# Patient Record
Sex: Female | Born: 1975
Health system: Southern US, Community
[De-identification: ages and names within clinical notes are randomized; demographics above are authoritative.]

## PROBLEM LIST (undated history)

## (undated) DIAGNOSIS — Z5189 Encounter for other specified aftercare: Secondary | ICD-10-CM

## (undated) DIAGNOSIS — D649 Anemia, unspecified: Secondary | ICD-10-CM

## (undated) DIAGNOSIS — I10 Essential (primary) hypertension: Secondary | ICD-10-CM

## (undated) DIAGNOSIS — M549 Dorsalgia, unspecified: Secondary | ICD-10-CM

## (undated) HISTORY — PX: PARTIAL HYSTERECTOMY: SHX80

## (undated) HISTORY — PX: ABDOMINAL HYSTERECTOMY: SHX81

---

## 2000-01-16 ENCOUNTER — Encounter: Payer: Self-pay | Admitting: Emergency Medicine

## 2000-01-16 ENCOUNTER — Emergency Department (HOSPITAL_COMMUNITY): Admission: EM | Admit: 2000-01-16 | Discharge: 2000-01-16 | Payer: Self-pay | Admitting: Emergency Medicine

## 2000-05-19 ENCOUNTER — Emergency Department (HOSPITAL_COMMUNITY): Admission: EM | Admit: 2000-05-19 | Discharge: 2000-05-19 | Payer: Self-pay | Admitting: Emergency Medicine

## 2000-08-24 ENCOUNTER — Other Ambulatory Visit: Admission: RE | Admit: 2000-08-24 | Discharge: 2000-08-24 | Payer: Self-pay | Admitting: Obstetrics and Gynecology

## 2000-11-19 ENCOUNTER — Emergency Department (HOSPITAL_COMMUNITY): Admission: EM | Admit: 2000-11-19 | Discharge: 2000-11-19 | Payer: Self-pay | Admitting: Emergency Medicine

## 2000-12-05 ENCOUNTER — Ambulatory Visit (HOSPITAL_COMMUNITY): Admission: RE | Admit: 2000-12-05 | Discharge: 2000-12-05 | Payer: Self-pay | Admitting: Obstetrics and Gynecology

## 2000-12-05 ENCOUNTER — Encounter: Payer: Self-pay | Admitting: Obstetrics and Gynecology

## 2001-01-11 ENCOUNTER — Ambulatory Visit (HOSPITAL_COMMUNITY): Admission: RE | Admit: 2001-01-11 | Discharge: 2001-01-11 | Payer: Self-pay | Admitting: Obstetrics and Gynecology

## 2001-01-11 ENCOUNTER — Encounter: Payer: Self-pay | Admitting: Obstetrics and Gynecology

## 2001-04-04 ENCOUNTER — Emergency Department (HOSPITAL_COMMUNITY): Admission: EM | Admit: 2001-04-04 | Discharge: 2001-04-04 | Payer: Self-pay | Admitting: Emergency Medicine

## 2001-05-12 ENCOUNTER — Emergency Department (HOSPITAL_COMMUNITY): Admission: EM | Admit: 2001-05-12 | Discharge: 2001-05-12 | Payer: Self-pay | Admitting: Emergency Medicine

## 2001-08-15 ENCOUNTER — Encounter: Admission: RE | Admit: 2001-08-15 | Discharge: 2001-11-13 | Payer: Self-pay | Admitting: Sports Medicine

## 2002-01-05 ENCOUNTER — Emergency Department (HOSPITAL_COMMUNITY): Admission: EM | Admit: 2002-01-05 | Discharge: 2002-01-05 | Payer: Self-pay | Admitting: *Deleted

## 2002-02-01 ENCOUNTER — Emergency Department (HOSPITAL_COMMUNITY): Admission: EM | Admit: 2002-02-01 | Discharge: 2002-02-01 | Payer: Self-pay | Admitting: Emergency Medicine

## 2002-03-05 ENCOUNTER — Emergency Department (HOSPITAL_COMMUNITY): Admission: EM | Admit: 2002-03-05 | Discharge: 2002-03-05 | Payer: Self-pay | Admitting: Emergency Medicine

## 2002-10-20 ENCOUNTER — Emergency Department (HOSPITAL_COMMUNITY): Admission: EM | Admit: 2002-10-20 | Discharge: 2002-10-20 | Payer: Self-pay | Admitting: Emergency Medicine

## 2002-11-26 ENCOUNTER — Emergency Department (HOSPITAL_COMMUNITY): Admission: EM | Admit: 2002-11-26 | Discharge: 2002-11-26 | Payer: Self-pay | Admitting: Emergency Medicine

## 2002-11-30 ENCOUNTER — Emergency Department (HOSPITAL_COMMUNITY): Admission: EM | Admit: 2002-11-30 | Discharge: 2002-11-30 | Payer: Self-pay | Admitting: Emergency Medicine

## 2002-12-18 ENCOUNTER — Other Ambulatory Visit: Admission: RE | Admit: 2002-12-18 | Discharge: 2002-12-18 | Payer: Self-pay | Admitting: Obstetrics and Gynecology

## 2003-03-20 ENCOUNTER — Emergency Department (HOSPITAL_COMMUNITY): Admission: AD | Admit: 2003-03-20 | Discharge: 2003-03-20 | Payer: Self-pay | Admitting: Family Medicine

## 2003-05-03 ENCOUNTER — Emergency Department (HOSPITAL_COMMUNITY): Admission: EM | Admit: 2003-05-03 | Discharge: 2003-05-03 | Payer: Self-pay | Admitting: Emergency Medicine

## 2003-08-07 ENCOUNTER — Emergency Department (HOSPITAL_COMMUNITY): Admission: EM | Admit: 2003-08-07 | Discharge: 2003-08-07 | Payer: Self-pay | Admitting: Family Medicine

## 2003-11-05 ENCOUNTER — Emergency Department (HOSPITAL_COMMUNITY): Admission: EM | Admit: 2003-11-05 | Discharge: 2003-11-05 | Payer: Self-pay | Admitting: Emergency Medicine

## 2003-12-12 ENCOUNTER — Emergency Department (HOSPITAL_COMMUNITY): Admission: EM | Admit: 2003-12-12 | Discharge: 2003-12-12 | Payer: Self-pay | Admitting: Emergency Medicine

## 2004-04-10 ENCOUNTER — Emergency Department (HOSPITAL_COMMUNITY): Admission: EM | Admit: 2004-04-10 | Discharge: 2004-04-10 | Payer: Self-pay | Admitting: Emergency Medicine

## 2004-12-09 ENCOUNTER — Emergency Department (HOSPITAL_COMMUNITY): Admission: EM | Admit: 2004-12-09 | Discharge: 2004-12-09 | Payer: Self-pay | Admitting: Emergency Medicine

## 2005-01-22 ENCOUNTER — Emergency Department (HOSPITAL_COMMUNITY): Admission: EM | Admit: 2005-01-22 | Discharge: 2005-01-22 | Payer: Self-pay | Admitting: Emergency Medicine

## 2005-02-26 ENCOUNTER — Emergency Department (HOSPITAL_COMMUNITY): Admission: EM | Admit: 2005-02-26 | Discharge: 2005-02-27 | Payer: Self-pay | Admitting: Emergency Medicine

## 2005-02-27 ENCOUNTER — Observation Stay (HOSPITAL_COMMUNITY): Admission: AD | Admit: 2005-02-27 | Discharge: 2005-02-28 | Payer: Self-pay | Admitting: Obstetrics and Gynecology

## 2005-02-27 ENCOUNTER — Emergency Department (HOSPITAL_COMMUNITY): Admission: EM | Admit: 2005-02-27 | Discharge: 2005-02-27 | Payer: Self-pay | Admitting: Family Medicine

## 2005-03-03 ENCOUNTER — Other Ambulatory Visit: Admission: RE | Admit: 2005-03-03 | Discharge: 2005-03-03 | Payer: Self-pay | Admitting: Obstetrics and Gynecology

## 2005-04-06 ENCOUNTER — Encounter (INDEPENDENT_AMBULATORY_CARE_PROVIDER_SITE_OTHER): Payer: Self-pay | Admitting: Specialist

## 2005-04-06 ENCOUNTER — Observation Stay (HOSPITAL_COMMUNITY): Admission: RE | Admit: 2005-04-06 | Discharge: 2005-04-07 | Payer: Self-pay | Admitting: Obstetrics and Gynecology

## 2005-04-23 ENCOUNTER — Inpatient Hospital Stay (HOSPITAL_COMMUNITY): Admission: AD | Admit: 2005-04-23 | Discharge: 2005-04-23 | Payer: Self-pay | Admitting: Obstetrics and Gynecology

## 2005-06-02 ENCOUNTER — Emergency Department (HOSPITAL_COMMUNITY): Admission: EM | Admit: 2005-06-02 | Discharge: 2005-06-02 | Payer: Self-pay | Admitting: Family Medicine

## 2005-07-02 ENCOUNTER — Emergency Department (HOSPITAL_COMMUNITY): Admission: EM | Admit: 2005-07-02 | Discharge: 2005-07-02 | Payer: Self-pay | Admitting: Family Medicine

## 2005-11-01 ENCOUNTER — Inpatient Hospital Stay (HOSPITAL_COMMUNITY): Admission: AD | Admit: 2005-11-01 | Discharge: 2005-11-01 | Payer: Self-pay | Admitting: Obstetrics and Gynecology

## 2005-12-26 ENCOUNTER — Emergency Department (HOSPITAL_COMMUNITY): Admission: EM | Admit: 2005-12-26 | Discharge: 2005-12-27 | Payer: Self-pay | Admitting: Emergency Medicine

## 2005-12-26 ENCOUNTER — Encounter: Payer: Self-pay | Admitting: Obstetrics and Gynecology

## 2006-02-03 ENCOUNTER — Emergency Department (HOSPITAL_COMMUNITY): Admission: EM | Admit: 2006-02-03 | Discharge: 2006-02-03 | Payer: Self-pay | Admitting: Emergency Medicine

## 2006-04-08 ENCOUNTER — Encounter (INDEPENDENT_AMBULATORY_CARE_PROVIDER_SITE_OTHER): Payer: Self-pay | Admitting: Specialist

## 2006-04-08 ENCOUNTER — Inpatient Hospital Stay (HOSPITAL_COMMUNITY): Admission: RE | Admit: 2006-04-08 | Discharge: 2006-04-11 | Payer: Self-pay | Admitting: Obstetrics and Gynecology

## 2006-04-17 ENCOUNTER — Inpatient Hospital Stay (HOSPITAL_COMMUNITY): Admission: AD | Admit: 2006-04-17 | Discharge: 2006-04-17 | Payer: Self-pay | Admitting: Obstetrics and Gynecology

## 2006-11-03 ENCOUNTER — Emergency Department (HOSPITAL_COMMUNITY): Admission: EM | Admit: 2006-11-03 | Discharge: 2006-11-03 | Payer: Self-pay | Admitting: Emergency Medicine

## 2007-05-08 ENCOUNTER — Encounter: Admission: RE | Admit: 2007-05-08 | Discharge: 2007-05-08 | Payer: Self-pay | Admitting: Gastroenterology

## 2007-07-31 ENCOUNTER — Ambulatory Visit (HOSPITAL_COMMUNITY): Admission: RE | Admit: 2007-07-31 | Discharge: 2007-07-31 | Payer: Self-pay | Admitting: Obstetrics and Gynecology

## 2007-07-31 ENCOUNTER — Encounter (INDEPENDENT_AMBULATORY_CARE_PROVIDER_SITE_OTHER): Payer: Self-pay | Admitting: Obstetrics and Gynecology

## 2007-10-03 ENCOUNTER — Emergency Department (HOSPITAL_COMMUNITY): Admission: EM | Admit: 2007-10-03 | Discharge: 2007-10-03 | Payer: Self-pay | Admitting: Emergency Medicine

## 2007-10-08 ENCOUNTER — Emergency Department (HOSPITAL_COMMUNITY): Admission: EM | Admit: 2007-10-08 | Discharge: 2007-10-09 | Payer: Self-pay | Admitting: Emergency Medicine

## 2007-10-30 ENCOUNTER — Emergency Department (HOSPITAL_COMMUNITY): Admission: EM | Admit: 2007-10-30 | Discharge: 2007-10-30 | Payer: Self-pay | Admitting: Emergency Medicine

## 2007-11-12 ENCOUNTER — Emergency Department (HOSPITAL_COMMUNITY): Admission: EM | Admit: 2007-11-12 | Discharge: 2007-11-12 | Payer: Self-pay | Admitting: Family Medicine

## 2007-11-18 ENCOUNTER — Emergency Department (HOSPITAL_COMMUNITY): Admission: EM | Admit: 2007-11-18 | Discharge: 2007-11-18 | Payer: Self-pay | Admitting: Emergency Medicine

## 2008-08-15 IMAGING — CT CT PELVIS W/ CM
3 of 4 series · 12 of 36 positions shown, 18 images · IV contrast (rectal contrast)
Comparison: 12/27/05.

CLINICAL DATA: Sharp right lower abdominal and pelvic pain.  Nausea and vomiting.  Clinical suspicion for appendicitis. 
ABDOMEN CT WITH CONTRAST:
TECHNIQUE: Multidetector CT imaging of the abdomen was performed following the standard protocol during bolus administration of intravenous contrast.
Contrast:  100 cc Omnipaque 300 and oral and rectal contrast.
TECHNIQUE: Multidetector CT imaging of the pelvis was performed following the standard protocol during bolus administration of intravenous contrast.

[Series 2: routine abdomen · axial · 0.77mm/px · z∈[-370,-26]mm · 8 of 89 slices shown, 13 images]
[im 10/89  soft-tissue]
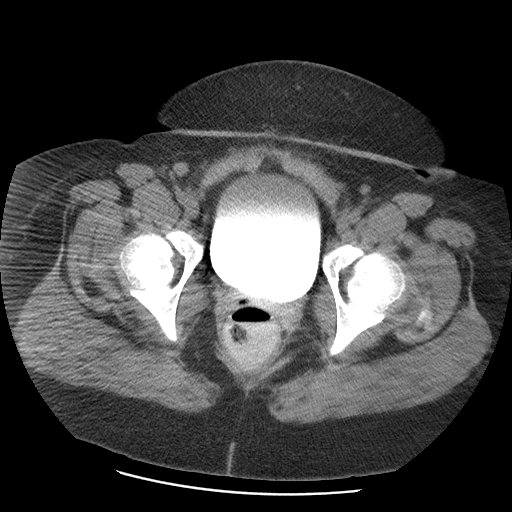
[im 10/89  bone]
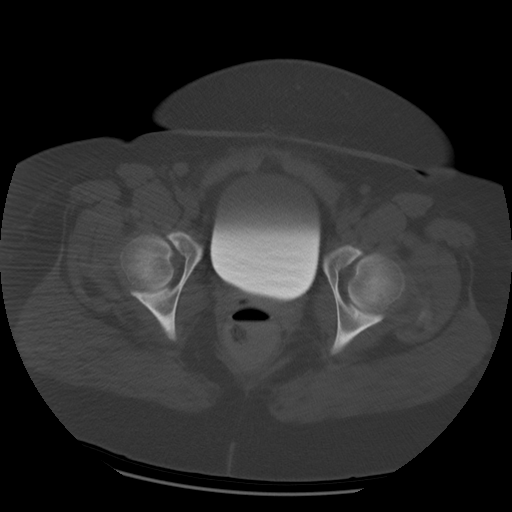
[im 20/89  soft-tissue]
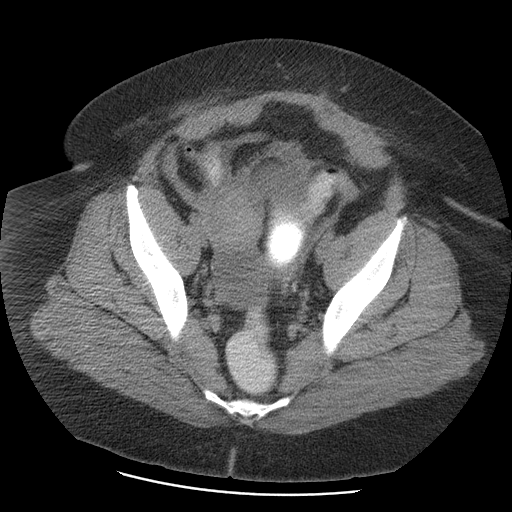
[im 30/89  soft-tissue]
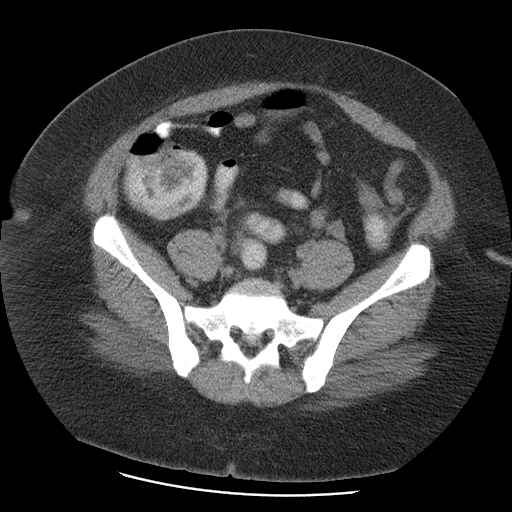
[im 40/89  soft-tissue]
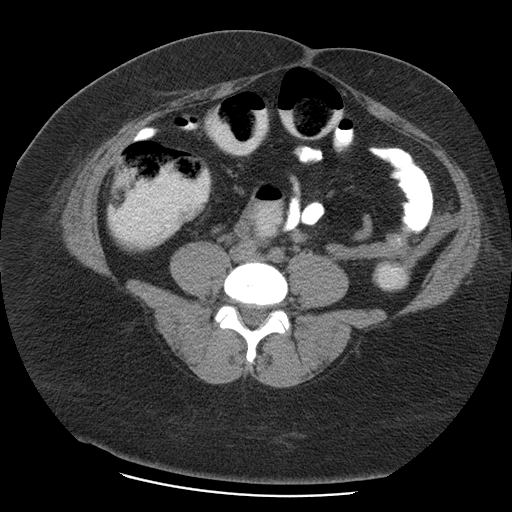
[im 49/89  soft-tissue]
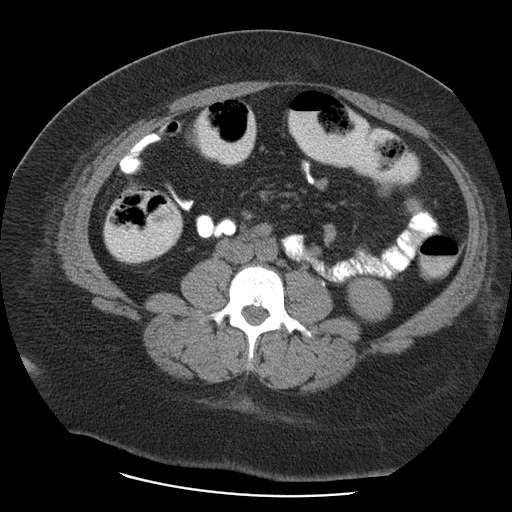
[im 49/89  lung]
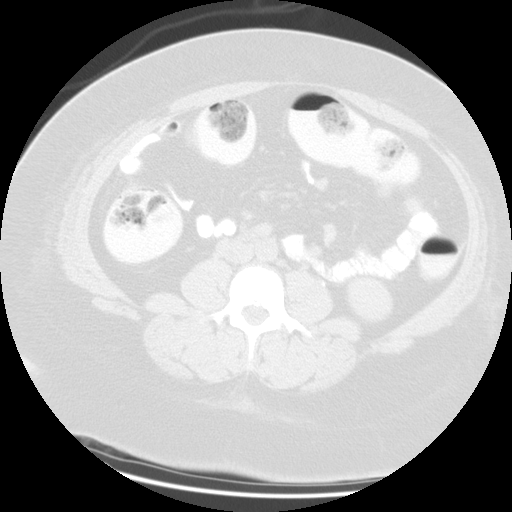
[im 59/89  soft-tissue]
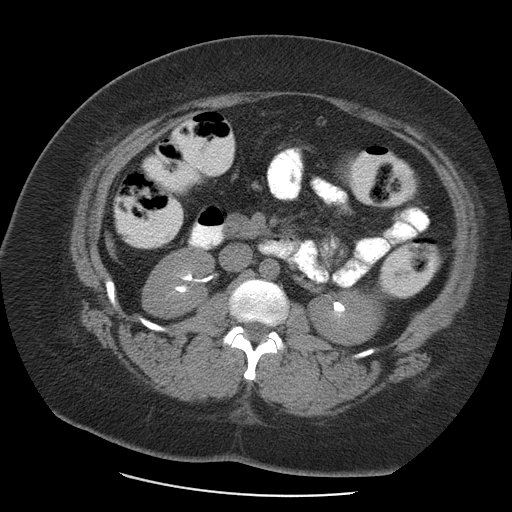
[im 59/89  lung]
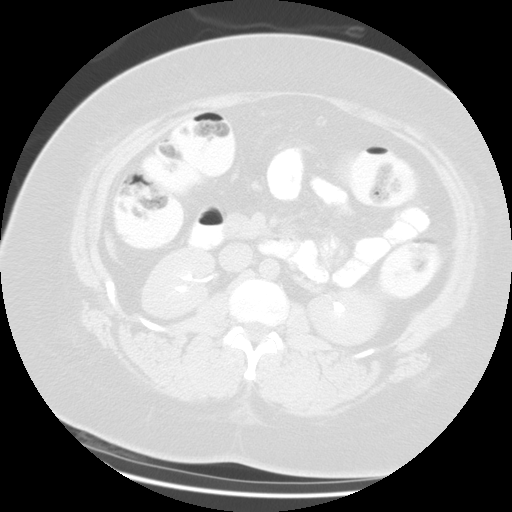
[im 69/89  soft-tissue]
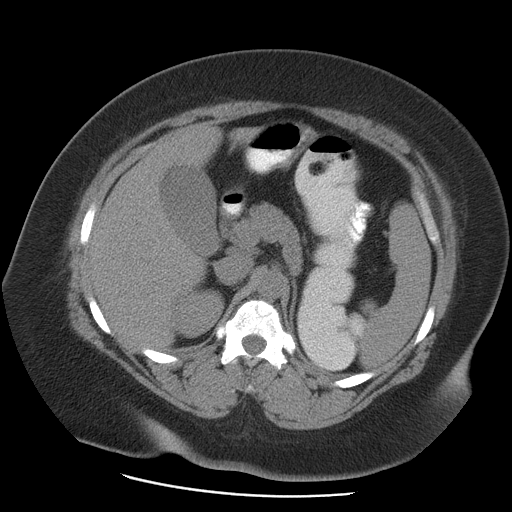
[im 69/89  lung]
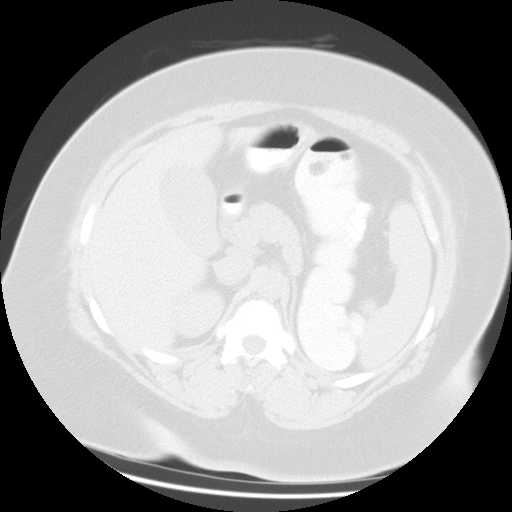
[im 79/89  soft-tissue]
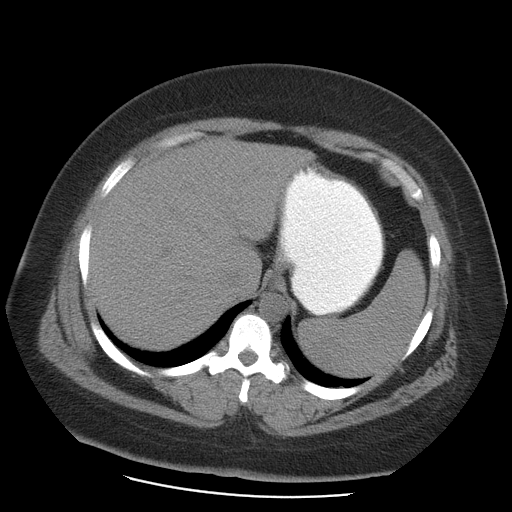
[im 79/89  lung]
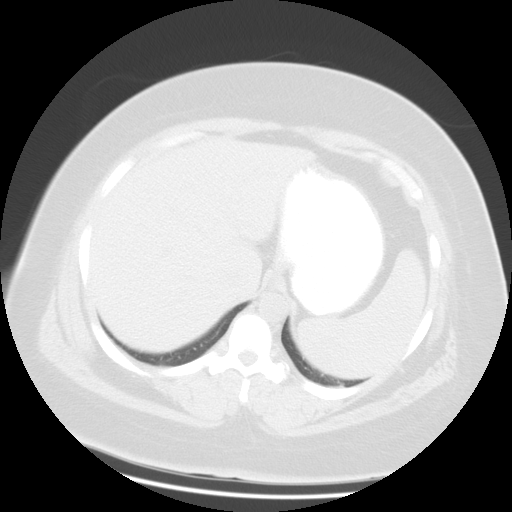

[Series 400: sag · sagittal · 0.95mm/px · 1 of 197 slices shown, 2 images]
[im 66/197  soft-tissue]
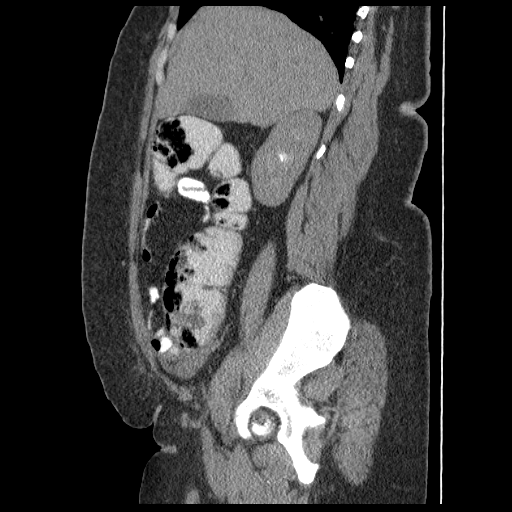
[im 66/197  bone]
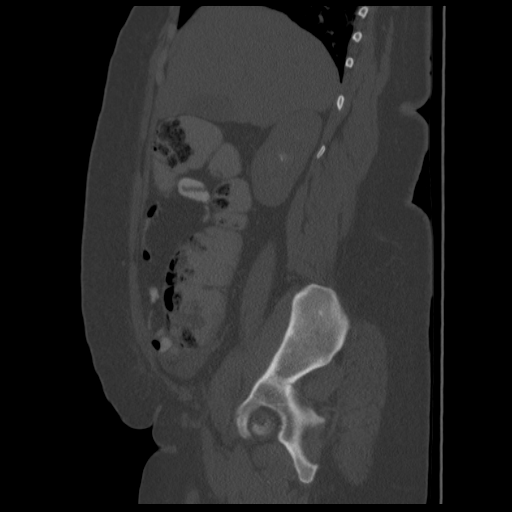

[Series 401: cor · coronal · 0.95mm/px · 3 of 177 slices shown]
[im 20/177  soft-tissue]
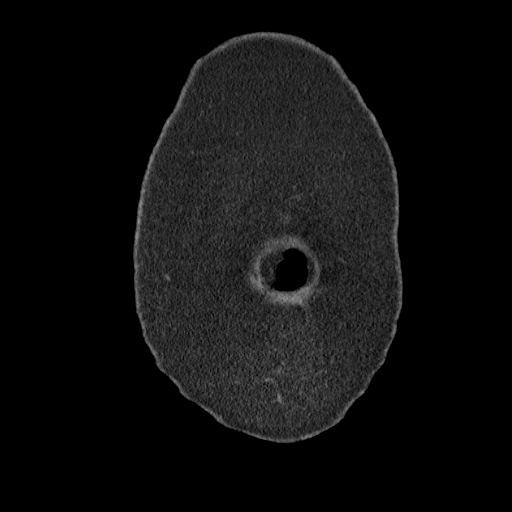
[im 40/177  soft-tissue]
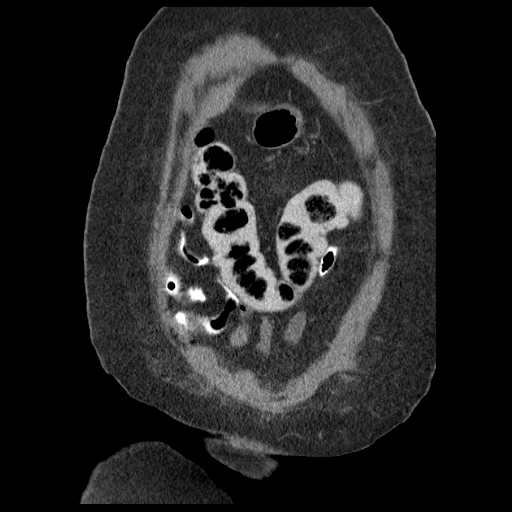
[im 59/177  soft-tissue]
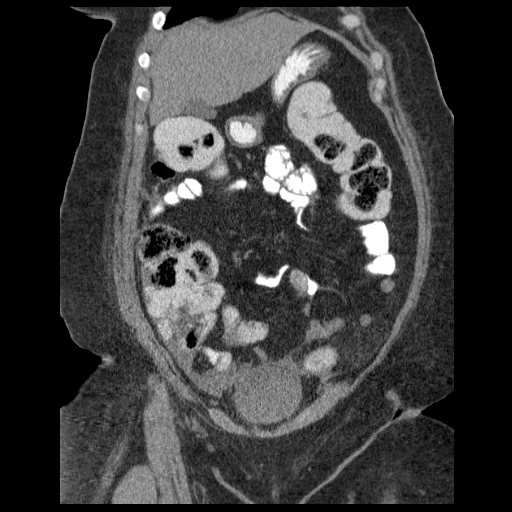

[12 of 36 positions shown; findings below may reference images not displayed]

FINDINGS: The abdominal parenchymal organs are normal in appearance.  There is no evidence of mass or lymphadenopathy within the abdomen.  There is no evidence of inflammatory process or abnormal fluid collections.  There is no evidence of dilated bowel loops.  A small supraumbilical anterior abdominal wall hernia is seen containing only omental fat.
IMPRESSION: 1.  No acute intraabdominal findings. 
2.  Small supraumbilical anterior abdominal wall hernia containing only omental fat.  
PELVIS CT WITH CONTRAST:
FINDINGS: Initial scan was limited due to oral contrast opacification of distal bowel, and the scan was repeated after rectal administration.  The appendix is not well visualized but no definite findings of acute appendicitis are identified.  There is a high attenuation mass in the right adnexa measuring 4.8 X 4.5 cm which appears separate from bowel.  This is new since previous study and is its high attenuation is suspicious for a hemorrhagic ovarian cyst although endometrioma could have a similar appearance.  A small amount of free fluid is seen in the right adnexa and pelvic cul-de-sac.
IMPRESSION: 4.8 cm high attenuation mass in the right adnexa, likely representing hemorrhagic cyst or possibly an endometrioma.  Small amount of free fluid or blood also seen in the right adnexa and pelvic cul-de-sac.  Follow-up with pelvic ultrasound is recommended in six weeks.

## 2008-12-25 ENCOUNTER — Encounter: Admission: RE | Admit: 2008-12-25 | Discharge: 2008-12-25 | Payer: Self-pay | Admitting: Family Medicine

## 2009-06-20 ENCOUNTER — Inpatient Hospital Stay (HOSPITAL_COMMUNITY): Admission: AD | Admit: 2009-06-20 | Discharge: 2009-06-20 | Payer: Self-pay | Admitting: *Deleted

## 2010-01-30 ENCOUNTER — Emergency Department (HOSPITAL_COMMUNITY)
Admission: EM | Admit: 2010-01-30 | Discharge: 2010-01-31 | Payer: Self-pay | Source: Home / Self Care | Admitting: Emergency Medicine

## 2010-05-12 LAB — GC/CHLAMYDIA PROBE AMP, URINE: GC Probe Amp, Urine: NEGATIVE

## 2010-05-12 LAB — URINE MICROSCOPIC-ADD ON

## 2010-05-12 LAB — URINALYSIS, ROUTINE W REFLEX MICROSCOPIC
Nitrite: NEGATIVE
Protein, ur: NEGATIVE mg/dL
Urobilinogen, UA: 0.2 mg/dL (ref 0.0–1.0)
pH: 6 (ref 5.0–8.0)

## 2010-05-12 LAB — WET PREP, GENITAL

## 2010-07-07 NOTE — Op Note (Signed)
[name], [name]              ACCOUNT NO.:  [id number]   MEDICAL RECORD NO.:  [id number]          PATIENT TYPE:  AMB   LOCATION:  SDC                           FACILITY:  [facility]   PHYSICIAN:  Valerie A. Dillard, M.D. DATE OF BIRTH:  1975/05/03   DATE OF PROCEDURE:  DATE OF DISCHARGE:                               OPERATIVE REPORT   PREOPERATIVE DIAGNOSES:  Chronic pelvic pain, endometriosis, and right  ovarian cyst.   POSTOPERATIVE DIAGNOSES:  Chronic pelvic pain, endometriosis, and right  ovarian cyst with abdominal adhesions.   PROCEDURES:  Open laparoscopy, right salpingo-oophorectomy.   SURGEON:  Valerie A. Dillard, MD   ASSISTANT:  Osborn Coho, MD.   FINDINGS:  Omental abdominal wall adhesions, calcifications, healing  along area of the cuff.  There was 1 purple to bluish lesion right along  the bladder near the cuff.  There was normal appearing appendix and then  the right ovary when it was being removed inside the EndoCatch bag, it  did appear that there was chocolate cyst fluid within the ovarian wall  consistent with endometrioma.  Right ovary and tube were sent to  pathology.   ESTIMATED BLOOD LOSS:  Minimal.   URINE OUTPUT:  500 mL of clear urine.   COMPLICATIONS:  None.   DISPOSITION:  The patient to PACU in stable condition.   PROCEDURE IN DETAIL:  The patient was taken to the operating room where  she was given general anesthesia, placed in dorsal lithotomy position  and prepped and draped in a normal sterile fashion.  A Foley catheter  was placed.  A Sponge and a stick was placed in the vagina.  Attention  was turned to the umbilicus where 5 mL 0.25% Marcaine with epi was  placed.  A transverse incision was made along her previous incision and  carried down to fascia.  The fascia was incised in the midline and  extended bilaterally and were in the abdominal cavity.  The fascia was  then circumscribed with 0 Vicryl and a Hasson was placed and anchored.  Upon looking into the abdominal cavity, the findings noted above were  seen.  Two 5-mm trocars were placed on the right and left lower  quadrants avoiding the epigastric arteries.  Marcaine was used and those  incisions off the patient's right ovary .  The patient's right ureter  was seen and noted to be peristaltic.  Two EndoCatch loops were placed  on the right infundibulopelvic ligament.  The infundibulopelvic ligament  was then cut and cauterized with Harmonic scalpel.  A 5-mm scope was  then placed into the lower left lower quadrant port and 10-mm bag was  placed into the pelvis.  The ovary was placed into the EndoCatch bag.  The EndoCatch bag did not fit out of the entire incisions so placing a  Kocher in the bag, we removed the ovary without difficulty.  The  endometrial fluid was seen to come out of the ovary as i had to put the  Porter on the ovary itself to remove it out of the incision.  The Hasson  was  removed and it was replaced and we insufflated with a 5-mm scope.  The abdominal wall adhesions did not involve any bowel and they were  hemostatic at the end of the case.  Her liver was noted to be normal.  Appendix was noted to be normal.  All instruments were then removed from  the abdomen.  After irrigation was done under direct visualization of  the laparoscope.  The fascial incision at the umbilicus was closed by  tying the pursestring suture.  The skin at the umbilicus was closed  using 3-0 Monocryl.  The 2 other small incisions were closed using  Dermabond.  Foley catheter was removed. The sponge and the stick was  removed from the vagina.  Sponge, lap, and needle counts were correct.  The patient went to recovery room in stable condition.  Before the  surgery, as the patient wanted to retain her ovaries, as it looked  normal, she says, she wanted it out secondary to pain and this her third  surgery in 3 years.  The patient understands that she may need hormone   replacement as a result of menopausal symptoms from having her ovary  removed.      [name], M.D.  Electronically Signed     NAD/MEDQ  D:  07/31/2007  T:  08/01/2007  Job:  [id number]

## 2010-07-07 NOTE — H&P (Signed)
[name], [name]              ACCOUNT NO.:  [id number]   MEDICAL RECORD NO.:  [id number]          PATIENT TYPE:  AMB   LOCATION:                                [facility]ACILITY:  [facility]   PHYSICIAN:  Naima A. Dillard, M.D. DATE OF BIRTH:  [birth year]   DATE OF ADMISSION:  DATE OF DISCHARGE:                              HISTORY & PHYSICAL   CHIEF COMPLAINT:  Chronic pelvic pain with ovarian cyst.   The patient is a 35 year old gravida 3, para 3 who has been followed for  endometriosis and chronic pelvic pain over the past several years by  myself.  In 2007 the patient had an LAVH secondary to dysfunctional  uterine bleeding and menorrhagia with cystectomy and was found to have  endometriosis.  She then had a left salpingo-oophorectomy in 2008 for  pain and endometriosis and wanted to keep her right ovary.  The patient  then came in complaining of constipation and pain in the last couple of  months and was seen by the GI doctor who felt like this was just her  endometriosis.  On ultrasound the patient had an enlarged right ovary  measuring 6 x 6 x 6 and chose to do observation.  After so many months  the patient had so much pain that she has decided to remove the ovary.  She was given the options of suppression with Lupron or birth control  pills but declined.  She was also optioned to have surgery with a gyn  oncologist but declined.   PAST MEDICAL HISTORY:  Significant for anemia.   PAST OBSTETRICAL HISTORY:  Significant for vaginal delivery times one  and cesarean section times two.   ALLERGIES:  No known drug allergies.   SOCIAL HISTORY:  The patient is married and husband is supportive.  She  denies any alcohol, tobacco or drug use.   FAMILY HISTORY:  Is significant for high blood pressure and diabetes.   REVIEW OF SYSTEMS:  CARDIOVASCULAR:  No heart palpitations.  LUNGS:  No  asthma.  GENITOURINARY:  Significant for endometriosis.  MUSCULOSKELETAL:  No weakness.  ENDOCRINE:  No  thyroid disease.   PHYSICAL EXAM:  VITAL SIGNS:  The patient weighs 298 pounds.  Blood  pressure is 120/88, temperature is 98.4.  HEENT:  Pupils equal, round and reactive to light.  Throat is clear.  Thyroid is not enlarged.  HEART:  Has regular rate and rhythm.  LUNGS:  Clear to auscultation bilaterally.  BREASTS:  No masses, skin changes or nipple retraction.  BACK:  No CVA tenderness bilaterally.  ABDOMEN:  Abdomen is nontender without any masses or organomegaly.  EXTREMITIES:  No cyanosis, clubbing or edema.  NEURO:  Exam within normal limits.  VAGINAL:  Exam within normal limits.  Cervix and uterus and left adnexa  are all surgically absent.  Right adnexa with some mild pain.   ASSESSMENT:  Endometriosis and persistent ovarian cyst consistent with  endometrioma and hydrosalpinx.  The patient has decided to have her  right ovary removed.  She understands that she may need hormone  replacement.  She is scheduled  to have a bowel prep.  She understands  the risks of the surgery are not limited to bleeding, infection, damage  to internal organs such as bowel, bladder, major blood vessels.  The  patient also understands she may have chronic pelvic pain for years and  may need to be referred to a pain clinic.  She still wants this ovary  removed.      Naima A. Normand Sloop, M.D.  Electronically Signed     NAD/MEDQ  D:  07/28/2007  T:  07/28/2007  Job:  [id number]

## 2010-07-10 NOTE — Op Note (Signed)
[name], [name]              ACCOUNT NO.:  [id number]   MEDICAL RECORD NO.:  [id number]          PATIENT TYPE:  AMB   LOCATION:  SDC                           [facility]ACILITY:  [facility]   PHYSICIAN:  Naima A. Dillard, M.D. DATE OF BIRTH:  01-Apr-1975   DATE OF PROCEDURE:  04/08/2006  DATE OF DISCHARGE:                               OPERATIVE REPORT   PREOPERATIVE DIAGNOSES:  1. Pelvic pain.  2. Persistent left ovarian cyst.   POSTOPERATIVE DIAGNOSES:  1. Pelvic pain.  2. Persistent left ovarian cyst.  3. Left endometrioma.  4. Dense pelvic adhesions.   PROCEDURE:  1. Left salpingo-oophorectomy with lysis of adhesions.  2. Cystoscopy.   SURGEON:  Dr. Normand Sloop.   ASSISTANTS:  1. Dr. Su Hilt.  2. Henreitta Leber.   ANESTHESIA:  General.   SPECIMENS:  Left tubo-ovarian complex, sent to Pathology.   ESTIMATED BLOOD LOSS:  200 mL.   IV FLUIDS:  2500 mL.   URINE OUTPUT:  700 mL.   COMPLICATIONS:  None.   The patient went to PACU in stable condition.   PROCEDURE:  The patient was taken to the operating room where she was  given a general anesthesia, placed in the dorsal lithotomy position,  prepped and draped in a normal sterile fashion.  Sponge-on-a-stick was  placed into the vagina just so we would know where we are.  Attention  was then turned to the umbilicus where 5 mL 0.25% Marcaine was placed in  the incision.  The incision was then made on a prior incision with a  knife and carried down to the fascia.  Using the knife, fascia was  incised in the midline, peritoneum identified, tented up and entered  sharply.  The fascia was then circumscribed with #0 Vicryl.  The Hasson  was placed into the abdominal cavity and anchored with the suture placed  in the fascia.  The abdomen was then insufflated with CO2 gas.  Intraabdominal placement was confirmed with laparoscope.  The findings  were as noted.  Liver appeared normal.  Right ovary was normal.  There  was a large left  ovarian cyst with dense pelvis and colonic adhesions.  Two 5 mm trocars were placed on the right and left lower quadrants and  one 10 mm trocar was placed in the midline 2 cm above the symphysis  pubis.  Irrigation was done and then began dissecting both sharply with  Metzenbaum scissors, then bluntly in the colonic and dense pelvic  adhesions.  However, I was able to dissect the left ovary from the  pelvis but there were some colonic adhesions that I just could not  dissect towards the left side of the ovary.  Initially had trouble  maintaining the patient in Trendelenburg and retracting the bowel  because of her size, so decision was made to open the patient.  A  Pfannenstiel skin incision was made along the laparoscopic port and  along a previous incision with the knife.  This was carried down to the  fascia using Bovie cautery.  The fascia was incised and the midline  extended bilaterally using  Mayo scissors and pickups with teeth.  Kochers x2 were placed on the inferior aspect of the fascia.  I was able  to dissect out the rectal mucosa both sharply and bluntly.  The superior  aspect of the fascia was dissected in a similar fashion.  The muscles  were separated in the midline.  The peritoneum was identified, entered  sharply and dissected from superiorly down.  The irrigation that was  previously in the pelvis was irrigated out.  The bowel was packed away  with moist laparotomy sponges.  The O'Connor-O'Sullivan was placed.  There were some still dense adhesions along the infundibulopelvic  ligament and left ovary and tube, they were dissected both sharply and  bluntly.  First the tube was removed to get better visualization, then  part of the ovary, then finally I could see the IP but I just could not  see the ureter because of the dense adhesions.  The infundibulopelvic  ligament was then clamped as high as possible, it was tied with a free  tie and then suture ligated with #0  Vicryl.  Hemostasis was assured.  There was some oozing just in the pelvis after the dissection that was  made hemostatic with Gelfoam.  Interceed was placed around the right  normal ovary.  Irrigation was done, hemostasis was assured.  I then did  a cystoscopy.  Both ureters had good efflux with indigo carmine from  both ureters, ensuring that the left ureter was normal.  Then I  proceeded to close the peritoneum with #0 chromic.  The rectal mucosa  was irrigated and made hemostatic with Bovie cautery any bleeding areas.  The fascia was closed with #0 Vicryl in a running fashion.  The  subcutaneous tissue was irrigated.  A JP drain was placed and the  subcutaneous tissue was reapproximated using #2-0 plain.  The skin  incision was closed using #3-0 Monocryl the laparoscopic port at the  umbilicus.  The fascia was closed with #0 Vicryl, the skin was closed  with #4-0 Monocryl.  The JP drain was sutured in with silk.  Sponge, lap  and needle counts were correct.  The patient went to the recovery room  in stable condition.  The sponge stick was removed from the vagina  before the patient was awakened.      Naima A. Normand Sloop, M.D.  Electronically Signed     NAD/MEDQ  D:  04/08/2006  T:  04/08/2006  Job:  [id number]

## 2010-07-10 NOTE — H&P (Signed)
NAMENELSON, JULSON              ACCOUNT NO.:  [id number]   MEDICAL RECORD NO.:  [id number]          PATIENT TYPE:  AMB   LOCATION:  [facility]C                           [facility]ACILITY:  [facility]   PHYSICIAN:  Naima A. Dillard, M.D. DATE OF BIRTH:  01/28/1976   DATE OF ADMISSION:  DATE OF DISCHARGE:                              HISTORY & PHYSICAL   ADDENDUM TO PREVIOUS H&P NUMBER [id number]   ADDENDUM:  Her height is 5 feet 7 and one-half inches tall.      Elmira J. Adline Peals.      Naima A. Normand Sloop, M.D.  Electronically Signed    EJP/MEDQ  D:  04/04/2006  T:  04/04/2006  Job:  [id number]

## 2010-07-10 NOTE — Discharge Summary (Signed)
NAMEMORRISON, [name]              ACCOUNT NO.:  [id number]   MEDICAL RECORD NO.:  [id number]          PATIENT TYPE:  OBV   LOCATION:  9309                          [facility]ACILITY:  [facility]   PHYSICIAN:  Naima A. Dillard, M.D. DATE OF BIRTH:  [birth year]   DATE OF ADMISSION:  04/06/2005  DATE OF DISCHARGE:  04/07/2005                                 DISCHARGE SUMMARY   DISCHARGE DIAGNOSES:  1.  Dysfunctional uterine bleeding.  2.  Adenomyosis.  3.  Menorrhagia.  4.  Left endometrioma.  5.  Pelvic adhesions.   OPERATION:  On the date of admission the patient underwent a  laparoscopically-assisted vaginal hysterectomy with lysis of adhesions and a  left ovarian cystectomy, tolerating all procedures well.   HISTORY OF PRESENT ILLNESS:  Ms. [name] is a 35 year old female para 3-0-0-3  who presented to the hospital with severe anemia (hemoglobin 6.4) believed  to be secondary to menorrhagia. A pelvic ultrasound done at that time  revealed that the patient had a 7 cm adnexal mass for which the patient  desired surgical management along with definitive treatment of her  menorrhagia. Please see the patient's dictated history and physical  examination for details.   PREOPERATIVE PHYSICAL EXAMINATION:  VITAL SIGNS:  Blood pressure 138/100.  Repeat blood pressure 110/80, weight 300 pounds.  GENERAL EXAM:  Within normal limits.  PELVIC EXAM:  Complete vaginal exam within normal limits. Cervix is  nontender without lesions. Uterus is normal size, shape and consistency and  nontender. Adnexa had no palpable masses and are nontender.   HOSPITAL COURSE:  On the date of admission the patient underwent  aforementioned procedures, tolerating them all well. Postoperative course  was unremarkable with the patient tolerating a postoperative hemoglobin of  8.9 (preoperative hemoglobin 10.9). By postoperative day #1 the patient had  resumed bowel and bladder function and was therefore deemed ready for  discharge home.   DISCHARGE MEDICATIONS:  1.  Iron tablets twice daily for 6 weeks.  2.  Colace 100 mg twice daily until bowel movements are regular.  3.  Tylox one to two tablets every 4 hours as needed for pain.  4.  Ibuprofen 600 mg with food every 6 hours for 3 days, then as needed for      pain.  5.  Phenergan 25 mg every 6 hours as needed for nausea.   FOLLOWUP:  The patient is scheduled for 6 weeks postoperative visit with Dr.  Normand Sloop on May 18, 2005, at 1:45 p.m.   DISCHARGE INSTRUCTIONS:  The patient was given a copy of [facility] postoperative instruction sheet. She was further advised to avoid  driving for 2 weeks, heavy lifting for 4 weeks, intercourse for 6 weeks,  that she may shower, walk up steps and is to increase her activities slowly.  The patient's diet was without restriction. Final pathology is not legible.      [name].      Naima A. Normand Sloop, M.D.  Electronically Signed    EJP/MEDQ  D:  05/13/2005  T:  05/14/2005  Job:  [id number]

## 2010-07-10 NOTE — Discharge Summary (Signed)
[name], CHARITY              ACCOUNT NO.:  [id number]   MEDICAL RECORD NO.:  [id number]          PATIENT TYPE:  INP   LOCATION:  9317                          FACILITY:  [facility]   PHYSICIAN:  Naima A. Dillard, M.D. DATE OF BIRTH:  [birth year]   DATE OF ADMISSION:  04/08/2006  DATE OF DISCHARGE:  04/11/2006                               DISCHARGE SUMMARY   DISCHARGE DIAGNOSIS:  1. Pelvic pain.  2. Left endometrioma.  3. Pelvic adhesions.   OPERATION:  On the date of admission, the patient underwent a diagnostic  laparoscopy, followed by an exploratory laparotomy with lysis of  adhesions, and a left salpingo-oophorectomy, tolerating all procedures  well.  The patient also underwent a cystoscopy which revealed patent  ureters bilaterally.   HISTORY OF PRESENT ILLNESS:  [name] is a 35 year old, married,  African American female para 3-0-0-3, who is status post hysterectomy  who presents for laparoscopic left salpingo-oophorectomy because of a  persistent complex left ovarian cyst and pelvic pain.  Please see the  patient's dictated history and physical examination for details.   PHYSICAL EXAMINATION:  VITAL SIGNS:  Blood pressure 120/90, weight is  200 pounds.  GENERAL:  within normal limits.  PELVIC:  EG/BUS is within normal limits.  Vagina is rugose.  Cervix and  uterus are surgically absent.  Adnexa with left adnexal tenderness,  however, there are no palpable masses.  Right adnexa without tenderness  or masses.   HOSPITAL COURSE:  On the date of admission, the patient underwent  aforementioned procedures tolerating them all well.  The patient's  postoperative course was marked by a slow return of bowel function and  what was believed to be bleeding per rectum.  After further evaluation,  it was believed that the patient's rectal bleeding was a result of red  Jello that she had consumed during her hospital stay.  By post-op day  #3, the patient had resumed bowel and  bladder function and was therefore  deemed ready for discharge home.   DISCHARGE LABORATORY:  The white blood count 6.6, hemoglobin 9.3,  hematocrit 27.8, platelets 189.  The patient's basic metabolic panel was  within normal limits.  The patient's preoperative hemoglobin and  hematocrit were 11 and 33.5, respectively.   DISCHARGE MEDICATIONS:  1. Percocet 5/325, one to two tablets every 4 hours as needed for      pain.  2. Ibuprofen 800 mg with food every 8 hours for 3 days, then as needed      for pain.  3. Colace 100 mg twice daily until bowel movements are regular.   FOLLOWUP:  1. The patient was advised to call [facility] at [phone], to schedule an incision check in 2 weeks and then a      postoperative visit with Dr. Normand Powell in 6 weeks.  2. The patient was also advised to call and schedule an appointment      with Dr. Laurann Powell for further evaluation of rectal bleeding      within 2 weeks following her discharge.   DISCHARGE  INSTRUCTIONS:  The patient was given a copy of [facility] postoperative instruction sheet.  She was further  advised to avoid driving for 2 weeks, heavy lifting for 4 weeks,  intercourse for 6 weeks, that she may walk up steps, that she may  shower, should increase her activities slowly, and her diet was without  restriction.   FINAL PATHOLOGY:  Left ovary and fallopian tube - tubal ovarian complex  with acute salpingo-oophoritis and hemorrhage.      Elmira J. Adline Peals.      Naima A. [name], M.D.  Electronically Signed    EJP/MEDQ  D:  04/30/2006  T:  04/30/2006  Job:  [id number]

## 2010-07-10 NOTE — Op Note (Signed)
[name], [name]              ACCOUNT NO.:  [id number]   MEDICAL RECORD NO.:  [id number]          PATIENT TYPE:  OBV   LOCATION:  9309                          FACILITY:  [facility]   PHYSICIAN:  Naima A. Dillard, M.D. DATE OF BIRTH:  [birth year]   DATE OF PROCEDURE:  04/06/2005  DATE OF DISCHARGE:                                 OPERATIVE REPORT   PREOPERATIVE DIAGNOSES:  1.  Dysfunctional uterine bleeding.  2.  Menorrhagia.  3.  Adnexal mass.   POSTOPERATIVE DIAGNOSES:  1.  Dysfunctional uterine bleeding.  2.  Menorrhagia.  3.  Adnexal mass.  4.  Left endometrioma.  5.  Pelvic adhesions.  6.  Right ovarian simple cyst.   OPERATION/PROCEDURE:  1.  Laparoscopic-assisted vaginal hysterectomy.  2.  Lysis of adhesions.  3.  Left ovarian cystectomy.   SURGEON:  Naima A. Normand Sloop, M.D.   ASSISTANTMarquis Lunch. Powell, P.A.-C.   ANESTHESIA:  General.   SPECIMENS:  Uterus, cervix, and left ovarian cyst wall.   ESTIMATED BLOOD LOSS:  400 mL.   URINARY OUTPUT:  150 mL clear urine.   IV FLUIDS:  4050 mL crystalloid.   COMPLICATIONS:  None.   CONDITION:  The patient went to the PACU in stable condition.   DESCRIPTION OF PROCEDURE:  The patient was taken to the operating room where  she was given general anesthesia and placed in the dorsal lithotomy  position, prepped and draped in the normal sterile fashion.  A bivalve  speculum was placed in the vagina.  The anterior lip of the cervix grasped  with a single-tooth tenaculum.  Hulka tenaculum was placed into the uterus.  The tenaculum and speculum were removed and Hulka was used to manipulate the  uterus.  Attention was then turned to the abdomen where a 10 mm  infraumbilical incision was made with the scalpel and carried down to the  fascia. The fascia was incised in the midline and extended bilaterally.  The  peritoneum was identified and entered sharply.  A pursestring suture of 0  Vicryl was placed around the fascia.   Hasson was placed into the intra-  abdominal and the abdomen was inflated with CO2 gas.  Intra-abdominal  placement was confirmed with the laparoscope.  Two 5 mm incisions were made.  Marcaine 0.25%, 3 mL, were placed in each trocar site.  The 5 mm incisions  were made and each trocar placed under direct visualization of the  laparoscope.  The patient had a large, about 7 cm, ovarian cyst on the  right. This was a small simple cyst.  Appendix appeared normal.  Abdominal  anatomy appeared normal.  She had large omental abdominal wall adhesions and  she had some left ovarian pelvic sidewall and tubal adhesions.  The very  first thing that was done was the omental adhesions.  There was no bowel in  the omental adhesions and the adhesion was taken down with sharp scissors on  cautery.  Hemostasis was assured.  The left ovarian tubal and sigmoid  adhesions were taken down with caution towards the sigmoid with both sharply  and bluntly without cautery.  The cul-de-sac in the anterior and the bladder  was found to be free.  The cyst was interrupted and noted to be  endometrioma.  Chocolate fluid was seen to exudate out of the cyst and this  was irrigated copiously with the suction irrigator.  The cyst wall was  removed without difficulty. Any bleeding areas were made hemostatic with the  Gyrus.  Upon this, both round ligament were grasped with a Gyrus and cut.  The bladder flap was then made with hydrodissection and vesicouterine  peritoneum was cut with the Metzenbaum scissors.  The bladder was dissected  away from the uterus and cervix.  The utero-ovarian ligaments were  cauterized and cut using the gyrus.  Before doing this, we tried to  visualize the ureters on both sides because the patient has had  endometriosis and there were endometrial implants all along the anterior  abdominal wall and in the posterior cul-de-sac.  As much as we could remove,  was removed.   Attention was then turned  to the vagina where a weighted speculum was placed  into the vagina and the Hulka was removed.  Two single-tooth tenacula were  placed on the cervix and the cervix was circumscribed with 20 mL of  Pitressin, 20 units in 100 mL saline.  A circumscribed incision was made  with the scalpel.  The bladder and rectal mucosa was sharply and bluntly  dissected away from the cervix.  The posterior cul-de-sac was entered  sharply with Mayo scissors and both uterosacral ligaments were clamped with  Heaney clamps, cut and ligated.  The anterior cul-de-sac was then also  opened and the bladder was seen to be fine.  Both cardinal ligaments were  clamped with Heaney clamps, cut and then suture ligated with 0 Vicryl.  The  uterine arteries were clamped and ligated.  Hemostasis was assured.  The  uterus was removed without difficulty and handed off to be sent to  pathology. The cuff was then irrigated. Any bleeding areas were hemostatic  with suture.  The McCall suture was placed using 0 Vicryl.  The cuff was  closed using 0 chromic in running locked fashion.  The McCall suture was  then tied both uterosacral ligaments were tied together and anchor stitched.  Hemostasis was assured.   Attention was then turned to the abdomen. Again the abdomen was insufflated  with CO2 gas. All areas were noted to be hemostatic.  Earlier in the case,  before taking down the utero-ovarian pedicles, we also needed more traction  on the uterus.  A 5 mm trocar was placed in the suprapubic region under  direct visualization of the laparoscope.  Copiously irrigated was done.  There were some clots that were removed.  All areas were noted to be  hemostatic.  All instruments were removed under direct visualization of the  laparoscope.  The fascia at the umbilicus was closed and the skin was closed  with 3-0 Monocryl.  All other incisions were closed with 3-0 Monocryl. Sponge, lap and needle counts were correct x2.  The patient  taken to the  recovery room in stable condition.      Naima A. Normand Sloop, M.D.  Electronically Signed     NAD/MEDQ  D:  04/06/2005  T:  04/06/2005  Job:  [id number]

## 2010-07-10 NOTE — H&P (Signed)
NAMEBELINDA, Powell              ACCOUNT NO.:  [id number]   MEDICAL RECORD NO.:  [id number]          PATIENT TYPE:  AMB   LOCATION:  SDC                           [facility]ACILITY:  [facility]   PHYSICIAN:  Naima A. Dillard, M.D. DATE OF BIRTH:  1976-02-16   DATE OF ADMISSION:  DATE OF DISCHARGE:                                HISTORY & PHYSICAL   CHIEF COMPLAINT:  Menorrhagia, dysfunctional uterine bleeding, and adnexal  mass.   HISTORY OF PRESENT ILLNESS:  The patient is a 35 year old gravida 3, para 3,  who presented to the emergency room in early January with shortness of  breath and heavy vaginal bleeding and severe abdominal pain.  The patient  was found to have a hemoglobin of 6.4.  She had an ultrasound significant  for adnexal mass measuring 7 x 5.7 x 5.7 cm.  It had both solid and cystic  components but with normal venous and arterial Dopplers.  CA-125 was normal  at 29.6.  TSH was found to be normal.  The patient also reports that she has  had heavy periods for years.  The patient reports her periods occur every 1-  2 months and last for 7-9 days.  She changes a pad every 30 minutes to an  hour.  The patient denies having any fibroids.  Her TSH was found to be  normal.  She does have abdominal pain off and on.  The patient has not been  on any new medications or contraceptives.  The patient desires definitive  treatment and ovarian cystectomy.   PAST MEDICAL HISTORY:  Significant for anemia and as above.   PAST OB HISTORY:  Significant for vaginal delivery x1 and cesarean section  x2.   SOCIAL HISTORY:  The patient denies any alcohol, tobacco, or drug use.   FAMILY HISTORY:  Significant for high blood pressure and diabetes.   ALLERGIES:  She has no known drug allergies.   MEDICATIONS:  Toradol, Vicodin, and Provera.  She is on Provera to help her  stop bleeding.   REVIEW OF SYSTEMS:  CARDIOVASCULAR:  significant for anemia.  GASTROINTESTINAL:  Unremarkable.  GENITOURINARY:   As above.  PSYCHIATRIC:  Unremarkable.  MUSCULOSKELETAL:  Unremarkable.  ENDOCRINE:  Unremarkable.  The patient also had von Willebrand's checked and hemoglobin electrophoresis  which was found to be normal.  PT/PTT was also normal.   PHYSICAL EXAMINATION:  VITAL SIGNS:  The patient's blood pressure is  138/100, repeat was 110/80.  She weighs 300 pounds.  HEENT:  Pupils are equal.  Hearing is normal.  Throat is clear.  NECK:  Thyroid is not enlarged.  HEART:  Regular rate and rhythm.  CHEST:  Clear to auscultation bilaterally.  BREASTS:  Have no masses, discharge, skin changes, or nipple retraction  bilaterally.  BACK:  Has no CVA tenderness.  ABDOMEN:  Nontender without any masses or organomegaly.  EXTREMITIES:  Have no cyanosis, clubbing, or edema.  NEUROLOGIC:  Within normal limits.  PELVIC:  Full vaginal exam is within normal limits.  Cervix is nontender  without any lesions.  Uterus is normal shape, size, and  consistency and  nontender.  Adnexa has no palpable masses and are nontender.   LABORATORY DATA:  Pertinent significant data:  Endometrial biopsy was found  to show benign proliferative endometrium.  Ultrasound was significant for  the adnexal mass and the uterus measured 9.4 x 4.4 x 4.9 cm.   ASSESSMENT:  1.  Adnexal mass.  2.  Dysfunctional uterine bleeding.  3.  Menorrhagia.   The patient desires hysterectomy.  The patient understands the risks.  She  signed hysterectomy consent in the office and will plan to do ovarian  cystectomy, possible oophorectomy.  The patient was also given alternative  treatments for dysfunctional uterine bleeding and menorrhagia, such as  observation, hormonal treatment, IUD, ablation.  The patient desires  hysterectomy.      Naima A. Normand Sloop, M.D.  Electronically Signed     NAD/MEDQ  D:  04/05/2005  T:  04/05/2005  Job:  [id number]

## 2010-07-10 NOTE — H&P (Signed)
NAMEMERCEDEES, CONVERY              ACCOUNT NO.:  [id number]   MEDICAL RECORD NO.:  [id number]          PATIENT TYPE:  INP   LOCATION:  9318                          [facility]ACILITY:  [facility]   PHYSICIAN:  Naima A. Dillard, M.D. DATE OF BIRTH:  [birth year]   DATE OF ADMISSION:  02/27/2005  DATE OF DISCHARGE:                                HISTORY & PHYSICAL   HISTORY:  Patient is a 35 year old gravida 3, para 3 who presented to the ER  with complaint of abdominal pain, shortness of breath and dizziness with  heavy menses.  Patient was in the ER yesterday and was diagnosed with a  complex ovarian mass and anemia but sent home to follow up with an OB-GYN.  She presented back today with abdominal pain and cramping.  The patient had  an ultrasound which showed an adnexal mass measuring 7 x 5.7 x 5.7.  It was  thick and solid in component with normal venous and arterial blood flow on  Doppler.  Hemoglobin of 6.4 and TSH was found to be normal.   PAST MEDICAL HISTORY:  Past medical history is significant for anemia.   PAST OBSTETRICAL HISTORY:  Past OB history significant for vaginal delivery  x1 and a C-section x2.   SOCIAL HISTORY:  Patient denies any alcohol, tobacco, or drug use.   FAMILY HISTORY:  Family history significant for high blood pressure and  diabetes.   ALLERGIES:  She has no known drug allergies.   MEDICATIONS:  Medications include Vicodin as needed.   PHYSICAL EXAMINATION:  Patient is in no apparent distress.  Head is  normocephalic and atraumatic.  Lungs are clear to auscultation bilaterally.  Breasts were deferred.  Abdomen with moderate tenderness, no rebound  tenderness.  Genitalia is within normal limits.  Cervix has no lesions.  Adnexa with mild tenderness.  Neck has free range of motion.  Heart is  regular rate and rhythm.  Back has no CVA tenderness.  Vagina is within  normal limits.  Uterus mild tenderness and mobile.  Ultrasound findings are  as above.   ASSESSMENT:   Complex ovarian mass and menorrhagia.  Patient was told that  the complex mass should be removed and scheduled.  She was given an  opportunity to be scheduled with a gynecologist/oncologist versus myself  with the small chance of cancer.  Patient wants to be scheduled with myself.  She will follow up in the office at the end of this week to schedule removal  of cyst.  She is given torsion precautions.  She will be admitted and given  4 units of blood.  I reviewed all options for menorrhagia.   PLAN:  Plan to do an endometrial biopsy in the office and options reviewed  were but not limited to observation, Depo-Provera, birth control, IUD,  ablation and hysterectomy.  Patient needs to talk it over with her husband  and will decide.      Naima A. Normand Sloop, M.D.  Electronically Signed     NAD/MEDQ  D:  02/27/2005  T:  02/27/2005  Job:  [id number]

## 2010-07-10 NOTE — H&P (Signed)
[name], JR              ACCOUNT NO.:  [id number]   MEDICAL RECORD NO.:  [id number]          PATIENT TYPE:  AMB   LOCATION:  SDC                           [facility]   PHYSICIAN:  Naima A. Dillard, M.D. DATE OF BIRTH:  [birth year]   DATE OF ADMISSION:  DATE OF DISCHARGE:                              HISTORY & PHYSICAL   HISTORY OF PRESENT ILLNESS:  Ms. [name] is a 35 year old, married,  African-American female, para 3-0-0-3 who is status post hysterectomy  who presents for a laparoscopic left salpingo-oophorectomy because of a  persistent complex left ovarian cyst and pelvic pain. The patient  underwent a laparoscopically assisted vaginal hysterectomy with lysis of  adhesions and a left ovarian cystectomy in February 2007 because of  menorrhagia and severe anemia (hemoglobin as low as 6.7). Pathology  report from that surgery showed a fibroid uterus with adenomyosis and a  left endometriotic ovarian cyst. Six months following her surgery, the  patient presented to the Maternity Admissions Unit at [facility]  [facility] complaining of left lower quadrant and left flank pain  resembling her preoperative discomfort. A pelvic ultrasound in September  2007 showed a questionable hemorrhagic left ovarian cyst measuring 3.1 x  2.1 x 2.6 cm with a small amount of free fluid in the cul-de-sac. At  that time, the patient was discharged home on narcotic medications and  antiinflammatory analgesia which seemingly managed her pain. A CBC,  urinalysis and comprehensive metabolic panel done at that time were all  within normal limits with the exception of the patient's hemoglobin  being 10.7 and hematocrit 32.9. The patient's symptoms resolved overtime  but recurred in November 2007 when she again presented to the maternity  admissions unit at [facility] [place] complaining of sudden  onset of left lower quadrant and flank pain which was progressively  worsening.  Pelvic ultrasound at that time showed a 3.3 cm complex left  ovarian cyst with complex free fluid in the cul-de-sac. A subsequent CA  125 was elevated at 39.2 but urinalysis and CBC were within normal  limits though again her hemoglobin and hematocrit were low at 10.5/32.0  respectively. Following this episode, the patient's pain continued  however intermittently with the patient requiring narcotic analgesia for  relief. The management option of ovarian suppression with hormonal  therapy was offered to patient. She desires instead definitive therapy  in the form of left salpingo-oophorectomy.   PAST MEDICAL HISTORY:   OB HISTORY:  Gravida 3, para 3-0-0-3. The patient had cesarean sections  x2 and spontaneous vaginal birth x1.   GYN HISTORY:  Menarche 35 years old. The patient has had a hysterectomy.  Denies any history of an abnormal Pap smear. Does have a history of  trichomonas however no other sexually transmitted disease. Last normal  Pap smear was January 2007.   MEDICAL HISTORY:  Anemia, blood transfusions.   SURGICAL HISTORY:  2007 laparoscopically assisted vaginal hysterectomy  with left ovarian cystectomy and lysis of adhesions.   FAMILY HISTORY:  Hypertension, diabetes mellitus.   HABITS:  The patient does not use alcohol or  tobacco.   SOCIAL HISTORY:  She is married and she works as a Radio broadcast assistant.   CURRENT MEDICATIONS:  Percocet 1-2 tablets every 4-6 h as needed for  pain.   ALLERGIES:  PEANUTS.   REVIEW OF SYSTEMS:  The patient denies chest pain, shortness of breath,  headache, fever, nausea, vomiting, diarrhea, urinary tract symptoms,  vaginitis symptoms and except as is mentioned in the history of present  illness, the patient's review of systems is negative.   PHYSICAL EXAMINATION:  VITAL SIGNS:  Blood pressure 120/90, weight 300  pounds.  NECK:  Supple, no masses, no thyromegaly or cervical adenopathy.  HEART:  Regular rate and rhythm.   LUNGS:  Clear.  BACK:  No CVA tenderness.  ABDOMEN:  No tenderness, masses or organomegaly.  EXTREMITIES:  No clubbing, cyanosis or edema.  PELVIC:  EGBUS is within normal limits. The vagina is rugose. The cervix  and uterus are surgically absent. Adnexa with left adnexal tenderness  however no palpable masses.   IMPRESSION:  1. Pelvic pain.  2. Persistent left ovarian cyst.   DISPOSITION:  A discussion was held with the patient regarding the  indications for her procedure along with its risks which include but not  limited to reaction to anesthesia, damage to adjacent organs, infection  and excessive bleeding. The patient has consented to proceed with a  laparoscopic left salpingo-oophorectomy at [facility]  [place] on April 08, 2006 at 9:00 a.m.      [place] J. Adline Peals.      Naima A. Normand Sloop, M.D.  Electronically Signed    EJP/MEDQ  D:  04/04/2006  T:  04/04/2006  Job:  [id number]

## 2010-09-15 ENCOUNTER — Emergency Department (HOSPITAL_COMMUNITY)
Admission: EM | Admit: 2010-09-15 | Discharge: 2010-09-15 | Disposition: A | Discharge status: Home / Self Care | Payer: Self-pay | Attending: Emergency Medicine | Admitting: Emergency Medicine

## 2010-09-15 DIAGNOSIS — M79609 Pain in unspecified limb: Secondary | ICD-10-CM | POA: Insufficient documentation

## 2010-11-19 LAB — BASIC METABOLIC PANEL
Chloride: 104
GFR calc non Af Amer: >60
Glucose, Bld: 104 — ABNORMAL HIGH
Potassium: 3.5
Sodium: 132 — ABNORMAL LOW

## 2010-11-19 LAB — CBC
HCT: 35 — ABNORMAL LOW
Hemoglobin: 11.5 — ABNORMAL LOW
RDW: 17.2 — ABNORMAL HIGH
WBC: 6.2

## 2010-12-04 LAB — WET PREP, GENITAL
Clue Cells Wet Prep HPF POC: NONE SEEN
WBC, Wet Prep HPF POC: NONE SEEN
Yeast Wet Prep HPF POC: NONE SEEN

## 2010-12-04 LAB — CBC
HCT: 35.2 — ABNORMAL LOW
Hemoglobin: 11.1 — ABNORMAL LOW
WBC: 7.9

## 2010-12-04 LAB — URINALYSIS, ROUTINE W REFLEX MICROSCOPIC
Bilirubin Urine: NEGATIVE
Hgb urine dipstick: NEGATIVE
Specific Gravity, Urine: 1.01
pH: 8

## 2010-12-04 LAB — DIFFERENTIAL
Basophils Absolute: 0
Basophils Relative: 0
Monocytes Absolute: 0.4
Neutro Abs: 4.8
Neutrophils Relative %: 60

## 2010-12-04 LAB — POCT PREGNANCY, URINE: Operator id: 146091

## 2010-12-04 LAB — COMPREHENSIVE METABOLIC PANEL
Albumin: 3.6
Alkaline Phosphatase: 50
BUN: 7
Chloride: 108
Glucose, Bld: 85
Potassium: 3.7
Total Bilirubin: 0.4

## 2010-12-04 LAB — LIPASE, BLOOD: Lipase: 17

## 2011-07-30 ENCOUNTER — Emergency Department (HOSPITAL_COMMUNITY): Payer: No Typology Code available for payment source

## 2011-07-30 ENCOUNTER — Encounter (HOSPITAL_COMMUNITY): Payer: Self-pay | Admitting: *Deleted

## 2011-07-30 ENCOUNTER — Emergency Department (HOSPITAL_COMMUNITY)
Admission: EM | Admit: 2011-07-30 | Discharge: 2011-07-30 | Disposition: A | Discharge status: Home / Self Care | Payer: No Typology Code available for payment source | Attending: Emergency Medicine | Admitting: Emergency Medicine

## 2011-07-30 DIAGNOSIS — S161XXA Strain of muscle, fascia and tendon at neck level, initial encounter: Secondary | ICD-10-CM

## 2011-07-30 DIAGNOSIS — S239XXA Sprain of unspecified parts of thorax, initial encounter: Secondary | ICD-10-CM | POA: Insufficient documentation

## 2011-07-30 DIAGNOSIS — F17200 Nicotine dependence, unspecified, uncomplicated: Secondary | ICD-10-CM | POA: Insufficient documentation

## 2011-07-30 DIAGNOSIS — Y92410 Unspecified street and highway as the place of occurrence of the external cause: Secondary | ICD-10-CM | POA: Insufficient documentation

## 2011-07-30 DIAGNOSIS — M542 Cervicalgia: Secondary | ICD-10-CM | POA: Insufficient documentation

## 2011-07-30 DIAGNOSIS — S29019A Strain of muscle and tendon of unspecified wall of thorax, initial encounter: Secondary | ICD-10-CM

## 2011-07-30 DIAGNOSIS — S139XXA Sprain of joints and ligaments of unspecified parts of neck, initial encounter: Secondary | ICD-10-CM | POA: Insufficient documentation

## 2011-07-30 MED ORDER — HYDROCODONE-ACETAMINOPHEN 5-325 MG PO TABS: 1.0000 | ORAL_TABLET | Freq: Four times a day (QID) | ORAL | Status: AC | PRN

## 2011-07-30 MED ORDER — IBUPROFEN 800 MG PO TABS: 800.0000 mg | ORAL_TABLET | Freq: Three times a day (TID) | ORAL | Status: AC | PRN

## 2011-07-30 NOTE — ED Provider Notes (Signed)
Medical screening examination/treatment/procedure(s) were performed by non-physician practitioner and as supervising physician I was immediately available for consultation/collaboration.   Anne Sebring, MD 07/30/11 1831 
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
## 2011-07-30 NOTE — Discharge Instructions (Signed)
Your x-rays were normal. Return here as needed. You will be more sore over the next 7-10 days. Use ice and heat on the areas that are sore.
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
## 2011-09-01 ENCOUNTER — Emergency Department (INDEPENDENT_AMBULATORY_CARE_PROVIDER_SITE_OTHER): Payer: BC Managed Care – PPO

## 2011-09-01 ENCOUNTER — Emergency Department (INDEPENDENT_AMBULATORY_CARE_PROVIDER_SITE_OTHER)
Admission: EM | Admit: 2011-09-01 | Discharge: 2011-09-01 | Disposition: A | Discharge status: Home / Self Care | Payer: BC Managed Care – PPO | Source: Home / Self Care

## 2011-09-01 ENCOUNTER — Encounter (HOSPITAL_COMMUNITY): Payer: Self-pay

## 2011-09-01 DIAGNOSIS — S62609A Fracture of unspecified phalanx of unspecified finger, initial encounter for closed fracture: Secondary | ICD-10-CM

## 2011-09-01 DIAGNOSIS — S62607A Fracture of unspecified phalanx of left little finger, initial encounter for closed fracture: Secondary | ICD-10-CM

## 2011-09-01 HISTORY — DX: Dorsalgia, unspecified: M54.9

## 2011-09-01 NOTE — ED Notes (Signed)
Forms completed and faxed to [employer] @ [phone] and confirmation received.  Copies made and pt. given back the originals in a brown envelope. Dr. 's notes not completed yet. Will fax that later if needed. Vassie Moselle 09/01/2011
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
## 2011-09-01 NOTE — ED Provider Notes (Signed)
History     CSN: [id number]  Arrival date & time 09/01/11  1126   First MD Initiated Contact with Patient 09/01/11 1232      Chief Complaint  Patient presents with  . Finger Injury   HPI  Patient presents to clinic after injury to LEFT little finger.  Date of injury was about 2 weeks ago.  She was standing in her kitchen and accidentally jammed her finger into the cabinet.  Pain was worse at night, so the next day she went to Urgent Care at [facility] where she had an X-ray done that showed a fracture.   Patient was given a splint for finger and told to follow up in 1-2 weeks, so patient came here for follow up.  She has been wearing splint for 2 weeks and says that swelling and redness has resolved.  She still endorses throbbing pain during the day, which is relieved by Vicodin and Motrin which she has at home for chronic neck/back pain.  She only takes pain medication 3-4 days/week.  Denies any associated joint pain, fever, chills, nausea/vomiting.  ROM gradually increasing, almost back to normal.  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   Past Medical History  Diagnosis Date  . Back pain     Past Surgical History  Procedure Date  . Cesarean section   . Partial hysterectomy     No family history on file.  History  Substance Use Topics  . Smoking status: Current Everyday Smoker  . Smokeless tobacco: Not on file  . Alcohol Use: No     reports quitting smoking a couple of weeks ago (07/30/11)    OB History    Grav Para Term Preterm Abortions TAB SAB Ect Mult Living                  Review of Systems  Per HPI  Allergies  Latex  Home Medications   Current Outpatient Rx  Name Route Sig Dispense Refill  . AMOXICILLIN 500 MG PO CAPS Oral Take 500 mg by mouth 3 (three) times daily.    Marland Kitchen HYDROCODONE-ACETAMINOPHEN 5-325 MG PO TABS Oral Take 1 tablet by mouth every 6 (six) hours as needed.    .  IBUPROFEN 800 MG PO TABS Oral Take 800 mg by mouth every 8 (eight) hours as needed.    Marland Kitchen NAPROXEN 500 MG PO TABS Oral Take 500 mg by mouth as needed.      BP 136/83  Pulse 74  Temp 98.2 F (36.8 C) (Oral)  Resp 16  SpO2 98%  Physical Exam  Constitutional: No distress.  Musculoskeletal:       Left hand: She exhibits decreased range of motion, tenderness and bony tenderness. She exhibits normal capillary refill, no deformity, no laceration and no swelling. normal sensation noted. Decreased strength noted.       Hands:   ED Course  Procedures   Labs Reviewed - No data to display Dg Finger Little Left  09/01/2011  *RADIOLOGY REPORT*  Clinical Data: Trauma with PIP joint pain  LEFT LITTLE FINGER 2+V  Comparison: None.  Findings: There is a fracture of the proximal volar corner of the middle phalanx consistent with a flexor tendon a avulsion.  No other finding.  IMPRESSION: Tiny fracture of the proximal volar corner of the middle phalanx consistent with flexor tendon avulsion.  Original Report Authenticated By: Loraine Leriche  E. [facility], M.D.     MDM  Will order X-ray of left fifth finger to ensure bone healing.  X-ray of LT little finger: Tiny fracture of the proximal volar corner of the middle phalanx  consistent with flexor tendon avulsion.  ASSESSMENT/PLAN:  Finger injury:  - Will apply splint to LT little finger in 30 degree flexion of both DIP and PIP joints - Patient to schedule follow up appointment with Dr. Mina Marble, Hand Surgeon within 7 days - May return to work with light duties/no typing if possible - Continue Ibuprofen PRN pain   Barnabas Lister, MD 09/01/11 1739

## 2011-09-01 NOTE — ED Notes (Signed)
Pt. brought in short term disability forms. Pt. put out for 1 week until she f/u with orthopedist.  Forms given to Dr. Tye Savoy.

## 2011-09-01 NOTE — ED Provider Notes (Signed)
Medical screening examination/treatment/procedure(s) were performed by a resident physician and as supervising physician I was immediately available for consultation/collaboration.  Additionally, I saw the patient independently, verified the history, examined the patient and discussed the treatment plan with the resident.  Leslee Home, M.D.    Reuben Likes, MD 09/01/11 2110

## 2012-07-10 ENCOUNTER — Ambulatory Visit: Payer: Self-pay

## 2012-07-10 ENCOUNTER — Other Ambulatory Visit: Payer: Self-pay | Admitting: Occupational Medicine

## 2012-07-10 DIAGNOSIS — R52 Pain, unspecified: Secondary | ICD-10-CM

## 2014-03-07 ENCOUNTER — Inpatient Hospital Stay (HOSPITAL_COMMUNITY)
Admission: AD | Admit: 2014-03-07 | Discharge: 2014-03-07 | Disposition: A | Discharge status: Home / Self Care | Payer: Managed Care, Other (non HMO) | Source: Ambulatory Visit | Attending: Family Medicine | Admitting: Family Medicine

## 2014-03-07 ENCOUNTER — Encounter (HOSPITAL_COMMUNITY): Payer: Self-pay | Admitting: *Deleted

## 2014-03-07 DIAGNOSIS — N644 Mastodynia: Secondary | ICD-10-CM | POA: Diagnosis not present

## 2014-03-07 DIAGNOSIS — Z87891 Personal history of nicotine dependence: Secondary | ICD-10-CM | POA: Diagnosis not present

## 2014-03-07 HISTORY — DX: Encounter for other specified aftercare: Z51.89

## 2014-03-07 HISTORY — DX: Anemia, unspecified: D64.9

## 2014-03-07 HISTORY — DX: Essential (primary) hypertension: I10

## 2014-03-07 LAB — URINALYSIS, ROUTINE W REFLEX MICROSCOPIC
Bilirubin Urine: NEGATIVE
Glucose, UA: NEGATIVE mg/dL
HGB URINE DIPSTICK: NEGATIVE
Ketones, ur: 15 mg/dL — AB
LEUKOCYTES UA: NEGATIVE
Nitrite: NEGATIVE
PH: 6 (ref 5.0–8.0)
Protein, ur: NEGATIVE mg/dL
SPECIFIC GRAVITY, URINE: 1.025 (ref 1.005–1.030)
Urobilinogen, UA: 1 mg/dL (ref 0.0–1.0)

## 2014-03-07 LAB — POCT PREGNANCY, URINE: Preg Test, Ur: NEGATIVE

## 2014-03-07 NOTE — Discharge Instructions (Signed)

## 2014-03-07 NOTE — MAU Provider Note (Signed)
History     CSN: [id number]  Arrival date and time: 03/07/14 2009   First Provider Initiated Contact with Patient 03/07/14 2120      Chief Complaint  Patient presents with  . Breast Pain   HPI  [name] is a 39 y.o. [id number] who presents today with left breast pain. She states that the pain is there constantly. She has tried ibuprofen, and it will help for some time. She denies any lump or skin changes. [place] is in [place]. She is moving here at the end of February. Patient reports drinking coffee daily. She also reports that her bras do not fit well. She has had to remove the underwire to make them fit better.   Past Medical History  Diagnosis Date  . Back pain   . Hypertension   . Anemia   . Blood transfusion without reported diagnosis     x3; s/p c/sections and hysterectomy    Past Surgical History  Procedure Laterality Date  . Cesarean section    . Partial hysterectomy      x3 to result in total hysterectomy    Family History  Problem Relation Age of Onset  . Hypertension Mother   . Diabetes Father   . Cancer Sister     leukemia  . Cancer Maternal Aunt     breast  . Cancer Paternal Aunt     breast  . Diabetes Maternal Grandmother   . Cancer Maternal Grandfather     stomach  . Diabetes Maternal Grandfather     History  Substance Use Topics  . Smoking status: Former Smoker    Quit date: 11/22/2012  . Smokeless tobacco: Not on file  . Alcohol Use: No     Comment: reports quitting smoking a couple of weeks ago (07/30/11)    Allergies:  Allergies  Allergen Reactions  . Peanut-Containing Drug Products Anaphylaxis  . Latex Rash    Prescriptions prior to admission  Medication Sig Dispense Refill Last Dose  . amoxicillin (AMOXIL) 500 MG capsule Take 500 mg by mouth 3 (three) times daily.   03/07/2014 at Unknown time  . Biotin w/ Vitamins C & E (HAIR SKIN & NAILS GUMMIES PO) Take 2 each by mouth daily.   Past Week at Unknown time  .  diphenhydrAMINE (BENADRYL) 25 MG tablet Take 25 mg by mouth every 6 (six) hours as needed for itching or allergies.   Past Month at Unknown time  . hydrochlorothiazide (HYDRODIURIL) 25 MG tablet Take 25 mg by mouth daily.   Past Week at Unknown time  . ibuprofen (ADVIL,MOTRIN) 200 MG tablet Take 800 mg by mouth every 6 (six) hours as needed for headache.   Past Week at Unknown time    ROS Physical Exam   Blood pressure 135/87, pulse 82, temperature 98.8 F (37.1 C), temperature source Oral, resp. rate 20, height 5\' 5"  (1.651 m), weight 141.579 kg (312 lb 2 oz).  Physical Exam  Nursing note and vitals reviewed. Constitutional: She is oriented to person, place, and time. She appears well-developed and well-nourished. No distress.  Cardiovascular: Normal rate.   Respiratory: Effort normal. Right breast exhibits no inverted nipple, no mass, no nipple discharge, no skin change and no tenderness. Left breast exhibits no inverted nipple, no mass, no nipple discharge, no skin change and no tenderness. Breasts are symmetrical.  GI: Soft. There is no tenderness.  Neurological: She is alert and oriented to person, place, and time.  Skin: Skin is  warm and dry.  Psychiatric: She has a normal mood and affect.    MAU Course  Procedures  Results for orders placed or performed during the hospital encounter of 03/07/14 (from the past 24 hour(s))  Urinalysis, Routine w reflex microscopic     Status: Abnormal   Collection Time: 03/07/14  8:29 PM  Result Value Ref Range   Color, Urine YELLOW YELLOW   APPearance CLEAR CLEAR   Specific Gravity, Urine 1.025 1.005 - 1.030   pH 6.0 5.0 - 8.0   Glucose, UA NEGATIVE NEGATIVE mg/dL   Hgb urine dipstick NEGATIVE NEGATIVE   Bilirubin Urine NEGATIVE NEGATIVE   Ketones, ur 15 (A) NEGATIVE mg/dL   Protein, ur NEGATIVE NEGATIVE mg/dL   Urobilinogen, UA 1.0 0.0 - 1.0 mg/dL   Nitrite NEGATIVE NEGATIVE   Leukocytes, UA NEGATIVE NEGATIVE  Pregnancy, urine POC      Status: None   Collection Time: 03/07/14  8:37 PM  Result Value Ref Range   Preg Test, Ur NEGATIVE NEGATIVE     Assessment and Plan   1. Pain of left breast    DC home Comfort measures reviewed Purchase better fitting bra Decrease caffeine  Return to MAU as needed FU with PCP as  Needed  Follow-up Information    Please follow up.   Why:  As needed   Contact information:   Dr. Patel  [place]        Tawnya Crook 03/07/2014, 9:21 PM

## 2014-03-07 NOTE — MAU Note (Signed)
PT SAYS SHE HAS BURNING   AND ACHING PAIN  IN LEFT  BREAST-  STARTED ON 02-22-2014.   NO DRAINAGE  FROM  NIPPLE.   NEVER  HAD MAMMOGRAM.      NOW  PAIN  IS 7.  - NO MEDS FOR  PAIN.  PT   FROM  OUT OF TOWN-- HER  DR IS IN [place].

## 2014-07-07 ENCOUNTER — Encounter (HOSPITAL_COMMUNITY): Payer: Self-pay | Admitting: Family Medicine

## 2014-07-07 ENCOUNTER — Emergency Department (HOSPITAL_COMMUNITY)
Admission: EM | Admit: 2014-07-07 | Discharge: 2014-07-07 | Disposition: A | Discharge status: Home / Self Care | Payer: BLUE CROSS/BLUE SHIELD | Source: Home / Self Care | Attending: Family Medicine | Admitting: Family Medicine

## 2014-07-07 DIAGNOSIS — G43009 Migraine without aura, not intractable, without status migrainosus: Secondary | ICD-10-CM | POA: Diagnosis not present

## 2014-07-07 MED ORDER — ONDANSETRON 4 MG PO TBDP
4.0000 mg | ORAL_TABLET | Freq: Three times a day (TID) | ORAL | Status: DC | PRN
Start: 2014-07-07 — End: 2015-06-18

## 2014-07-07 MED ORDER — ONDANSETRON 4 MG PO TBDP
4.0000 mg | ORAL_TABLET | Freq: Once | ORAL | Status: AC
  Administered 2014-07-07: 4 mg via ORAL

## 2014-07-07 MED ORDER — DEXAMETHASONE 4 MG PO TABS
10.0000 mg | ORAL_TABLET | Freq: Once | ORAL | Status: AC
  Administered 2014-07-07: 10 mg via ORAL

## 2014-07-07 MED ORDER — ACETAMINOPHEN 325 MG PO TABS
975.0000 mg | ORAL_TABLET | Freq: Once | ORAL | Status: AC
  Administered 2014-07-07: 975 mg via ORAL

## 2014-07-07 MED ORDER — DEXAMETHASONE 10 MG/ML FOR PEDIATRIC ORAL USE
INTRAMUSCULAR | Status: AC
  Filled 2014-07-07: qty 1

## 2014-07-07 MED ORDER — DIPHENHYDRAMINE HCL 12.5 MG/5ML PO ELIX
ORAL_SOLUTION | ORAL | Status: AC
  Filled 2014-07-07: qty 10

## 2014-07-07 MED ORDER — ACETAMINOPHEN 325 MG PO TABS
ORAL_TABLET | ORAL | Status: AC
  Filled 2014-07-07: qty 3

## 2014-07-07 MED ORDER — DIPHENHYDRAMINE HCL 25 MG PO CAPS
25.0000 mg | ORAL_CAPSULE | Freq: Once | ORAL | Status: AC
  Administered 2014-07-07: 25 mg via ORAL

## 2014-07-07 MED ORDER — ONDANSETRON 4 MG PO TBDP
ORAL_TABLET | ORAL | Status: AC
  Filled 2014-07-07: qty 1

## 2014-07-07 NOTE — ED Provider Notes (Signed)
CSN: [id number]     Arrival date & time 07/07/14  1332 History   None    Chief Complaint  Patient presents with  . Migraine   (Consider location/radiation/quality/duration/timing/severity/associated sxs/prior Treatment) HPI   HA started 2 days ago. Getting worse. Associated w/ nausea and lightheadedness. Pressure feeling. Now mostly on the R side. Worse w/ movement. Improves w/ rest. Photophobia and phonophobia. Under constant. Getting better or worse. H/o Migraines in the past and has seen a specialist. No migraine in 3 years.   Eyes chest pain, neck stiffness, change in cognitive abilities, change in vision, shortness of breath, palpitations, abdominal pain, dysuria, frequency, fevers, diarrhea.   Past Medical History  Diagnosis Date  . Back pain   . Hypertension   . Anemia   . Blood transfusion without reported diagnosis     x3; s/p c/sections and hysterectomy   Past Surgical History  Procedure Laterality Date  . Cesarean section    . Partial hysterectomy      x3 to result in total hysterectomy   Family History  Problem Relation Age of Onset  . Hypertension Mother   . Diabetes Father   . Cancer Sister     leukemia  . Cancer Maternal Aunt     breast  . Cancer Paternal Aunt     breast  . Diabetes Maternal Grandmother   . Cancer Maternal Grandfather     stomach  . Diabetes Maternal Grandfather    History  Substance Use Topics  . Smoking status: Former Smoker    Quit date: 11/22/2012  . Smokeless tobacco: Not on file  . Alcohol Use: No     Comment: reports quitting smoking a couple of weeks ago (07/30/11)   OB History    Gravida Para Term Preterm AB TAB SAB Ectopic Multiple Living   3 3 3       3      Review of Systems Per HPI with all other pertinent systems negative.   Allergies  Peanut-containing drug products and Latex  Home Medications   Prior to Admission medications   Medication Sig Start Date End Date Taking? Authorizing Provider  amoxicillin  (AMOXIL) 500 MG capsule Take 500 mg by mouth 3 (three) times daily.    Historical Provider, MD  Biotin w/ Vitamins C & E (HAIR SKIN & NAILS GUMMIES PO) Take 2 each by mouth daily.    Historical Provider, MD  diphenhydrAMINE (BENADRYL) 25 MG tablet Take 25 mg by mouth every 6 (six) hours as needed for itching or allergies.    Historical Provider, MD  hydrochlorothiazide (HYDRODIURIL) 25 MG tablet Take 25 mg by mouth daily.    Historical Provider, MD  ibuprofen (ADVIL,MOTRIN) 200 MG tablet Take 800 mg by mouth every 6 (six) hours as needed for headache.    Historical Provider, MD  ondansetron (ZOFRAN-ODT) 4 MG disintegrating tablet Take 1 tablet (4 mg total) by mouth every 8 (eight) hours as needed for nausea or vomiting. 07/07/14   Ozella Rocksavid J Suhaib Guzzo, MD   BP 136/90 mmHg  Pulse 68  Temp(Src) 97.5 F (36.4 C) (Oral)  Resp 18  SpO2 100% Physical Exam Physical Exam  Constitutional: oriented to person, place, and time. appears well-developed and well-nourished. No distress.  HENT:  Head: Normocephalic and atraumatic.  Eyes: EOMI. PERRL.  Neck: Normal range of motion.  Cardiovascular: RRR, no m/r/g, 2+ distal pulses,  Pulmonary/Chest: Effort normal and breath sounds normal. No respiratory distress.  Abdominal: Soft. Bowel sounds are normal. NonTTP,  no distension.  Musculoskeletal: Normal range of motion. Non ttp, no effusion.  Neurological: Cranial nerves II through XII intact, no dysmetria, moves all [employer] fashion, no nuchal rigidity. Skin: Skin is warm. No rash noted. non diaphoretic.  Psychiatric: normal mood and affect. behavior is normal. Judgment and thought content normal.   ED Course  Procedures (including critical care time) Labs Review Labs Reviewed - No data to display  Imaging Review No results found.   MDM   1. Migraine without aura and without status migrainosus, not intractable    Decadron 10 mg, Zofran 4 mg, Tylenol 975 mg, Benadryl 25 mg by mouth  administered in clinic.  Likely classic migraine without evidence of intracranial acute process. Patient was very restrict return precautions for which she will go to the emergency room if needed. Additional prescription for Zofran provided for nausea at home. Continue with rest, and start Tylenol and ibuprofen for additional relief. Follow-up with PCP if migraines become a chronic issue for further follow-up with neurology.   Ozella Rocksavid J Rekha Hobbins, MD 07/07/14 [phone]

## 2014-07-07 NOTE — Discharge Instructions (Signed)
Likely suffering from a migraine. You were given Decadron, Zofran, Benadryl, and Tylenol to help with this. Please go home and rest. Please use Zofran for additional nausea relief. Please use ibuprofen 600 mg or Tylenol 1000 mg were additional pain relief. Please go to the emergency room if you get worse. Because of this could be anything from a genetic predisposition, chemical smells, caffeine withdrawal.

## 2014-07-07 NOTE — ED Notes (Signed)
Migraine x 3 days. Pain is worse when standing and is sensitive to light.  Pt has also had some diarrhea. Pt family brought her today

## 2015-06-17 ENCOUNTER — Emergency Department (HOSPITAL_COMMUNITY): Payer: Managed Care, Other (non HMO)

## 2015-06-17 ENCOUNTER — Encounter (HOSPITAL_COMMUNITY): Payer: Self-pay | Admitting: Emergency Medicine

## 2015-06-17 ENCOUNTER — Emergency Department (HOSPITAL_COMMUNITY)
Admission: EM | Admit: 2015-06-17 | Discharge: 2015-06-18 | Disposition: A | Discharge status: Home / Self Care | Payer: Managed Care, Other (non HMO) | Attending: Emergency Medicine | Admitting: Emergency Medicine

## 2015-06-17 DIAGNOSIS — Z3202 Encounter for pregnancy test, result negative: Secondary | ICD-10-CM | POA: Insufficient documentation

## 2015-06-17 DIAGNOSIS — Z87891 Personal history of nicotine dependence: Secondary | ICD-10-CM | POA: Insufficient documentation

## 2015-06-17 DIAGNOSIS — R079 Chest pain, unspecified: Secondary | ICD-10-CM | POA: Diagnosis present

## 2015-06-17 DIAGNOSIS — G43009 Migraine without aura, not intractable, without status migrainosus: Secondary | ICD-10-CM

## 2015-06-17 DIAGNOSIS — Z862 Personal history of diseases of the blood and blood-forming organs and certain disorders involving the immune mechanism: Secondary | ICD-10-CM | POA: Diagnosis not present

## 2015-06-17 DIAGNOSIS — I10 Essential (primary) hypertension: Secondary | ICD-10-CM | POA: Diagnosis not present

## 2015-06-17 DIAGNOSIS — R0789 Other chest pain: Secondary | ICD-10-CM | POA: Diagnosis not present

## 2015-06-17 DIAGNOSIS — Z79899 Other long term (current) drug therapy: Secondary | ICD-10-CM | POA: Diagnosis not present

## 2015-06-17 DIAGNOSIS — G43909 Migraine, unspecified, not intractable, without status migrainosus: Secondary | ICD-10-CM | POA: Diagnosis not present

## 2015-06-17 DIAGNOSIS — R6889 Other general symptoms and signs: Secondary | ICD-10-CM

## 2015-06-17 LAB — CBC
HCT: 34.4 % — ABNORMAL LOW (ref 36.0–46.0)
Hemoglobin: 11 g/dL — ABNORMAL LOW (ref 12.0–15.0)
MCH: 20.6 pg — AB (ref 26.0–34.0)
MCHC: 32 g/dL (ref 30.0–36.0)
MCV: 64.5 fL — AB (ref 78.0–100.0)
Platelets: 177 10*3/uL (ref 150–400)
RBC: 5.33 MIL/uL — ABNORMAL HIGH (ref 3.87–5.11)
RDW: 16.7 % — ABNORMAL HIGH (ref 11.5–15.5)
WBC: 6.1 10*3/uL (ref 4.0–10.5)

## 2015-06-17 LAB — BASIC METABOLIC PANEL
Anion gap: 10 (ref 5–15)
BUN: 16 mg/dL (ref 6–20)
CHLORIDE: 106 mmol/L (ref 101–111)
CO2: 22 mmol/L (ref 22–32)
Calcium: 9.3 mg/dL (ref 8.9–10.3)
Creatinine, Ser: 1.22 mg/dL — ABNORMAL HIGH (ref 0.44–1.00)
GFR calc Af Amer: >60 mL/min (ref >60)
GFR calc non Af Amer: 55 mL/min — ABNORMAL LOW (ref >60)
GLUCOSE: 86 mg/dL (ref 65–99)
POTASSIUM: 4.1 mmol/L (ref 3.5–5.1)
Sodium: 138 mmol/L (ref 135–145)

## 2015-06-17 LAB — I-STAT TROPONIN, ED: Troponin i, poc: 0 ng/mL (ref 0.00–0.08)

## 2015-06-17 NOTE — ED Notes (Signed)
Pt. reports central chest pain with SOB onset this afternoon , pain increases with deep inspiration , denies nausea or diaphoresis .

## 2015-06-18 LAB — I-STAT TROPONIN, ED: TROPONIN I, POC: 0 ng/mL (ref 0.00–0.08)

## 2015-06-18 LAB — POC URINE PREG, ED: Preg Test, Ur: NEGATIVE

## 2015-06-18 MED ORDER — IBUPROFEN 800 MG PO TABS: 800.0000 mg | ORAL_TABLET | Freq: Three times a day (TID) | ORAL | Status: AC | PRN

## 2015-06-18 MED ORDER — SODIUM CHLORIDE 0.9 % IV BOLUS (SEPSIS)
1000.0000 mL | Freq: Once | INTRAVENOUS | Status: AC
  Administered 2015-06-18: 1000 mL via INTRAVENOUS

## 2015-06-18 MED ORDER — METOCLOPRAMIDE HCL 5 MG/ML IJ SOLN
10.0000 mg | Freq: Once | INTRAMUSCULAR | Status: AC
  Administered 2015-06-18: 10 mg via INTRAVENOUS
  Filled 2015-06-18: qty 2

## 2015-06-18 MED ORDER — ACETAMINOPHEN 500 MG PO TABS
1000.0000 mg | ORAL_TABLET | Freq: Once | ORAL | Status: AC
  Administered 2015-06-18: 1000 mg via ORAL
  Filled 2015-06-18: qty 2

## 2015-06-18 MED ORDER — DIPHENHYDRAMINE HCL 50 MG/ML IJ SOLN
25.0000 mg | Freq: Once | INTRAMUSCULAR | Status: AC
  Administered 2015-06-18: 25 mg via INTRAVENOUS
  Filled 2015-06-18: qty 1

## 2015-06-18 MED ORDER — KETOROLAC TROMETHAMINE 30 MG/ML IJ SOLN
30.0000 mg | Freq: Once | INTRAMUSCULAR | Status: AC
  Administered 2015-06-18: 30 mg via INTRAVENOUS
  Filled 2015-06-18: qty 1

## 2015-06-18 MED ORDER — ONDANSETRON 4 MG PO TBDP: 4.0000 mg | ORAL_TABLET | Freq: Three times a day (TID) | ORAL | Status: AC | PRN

## 2015-06-18 NOTE — ED Notes (Signed)
MD at bedside. 

## 2015-06-18 NOTE — Discharge Instructions (Signed)
Migraine Headache A migraine headache is an intense, throbbing pain on one or both sides of your head. A migraine can last for 30 minutes to several hours. CAUSES  The exact cause of a migraine headache is not always known. However, a migraine may be caused when nerves in the brain become irritated and release chemicals that cause inflammation. This causes pain. Certain things may also trigger migraines, such as:  Alcohol.  Smoking.  Stress.  Menstruation.  Aged cheeses.  Foods or drinks that contain nitrates, glutamate, aspartame, or tyramine.  Lack of sleep.  Chocolate.  Caffeine.  Hunger.  Physical exertion.  Fatigue.  Medicines used to treat chest pain (nitroglycerine), birth control pills, estrogen, and some blood pressure medicines. SIGNS AND SYMPTOMS  Pain on one or both sides of your head.  Pulsating or throbbing pain.  Severe pain that prevents daily activities.  Pain that is aggravated by any physical activity.  Nausea, vomiting, or both.  Dizziness.  Pain with exposure to bright lights, loud noises, or activity.  General sensitivity to bright lights, loud noises, or smells. Before you get a migraine, you may get warning signs that a migraine is coming (aura). An aura may include:  Seeing flashing lights.  Seeing bright spots, halos, or zigzag lines.  Having tunnel vision or blurred vision.  Having feelings of numbness or tingling.  Having trouble talking.  Having muscle weakness. DIAGNOSIS  A migraine headache is often diagnosed based on:  Symptoms.  Physical exam.  A CT scan or MRI of your head. These imaging tests cannot diagnose migraines, but they can help rule out other causes of headaches. TREATMENT Medicines may be given for pain and nausea. Medicines can also be given to help prevent recurrent migraines.  HOME CARE INSTRUCTIONS  Only take over-the-counter or prescription medicines for pain or discomfort as directed by your  health care provider. The use of long-term narcotics is not recommended.  Lie down in a dark, quiet room when you have a migraine.  Keep a journal to find out what may trigger your migraine headaches. For example, write down:  What you eat and drink.  How much sleep you get.  Any change to your diet or medicines.  Limit alcohol consumption.  Quit smoking if you smoke.  Get 7-9 hours of sleep, or as recommended by your health care provider.  Limit stress.  Keep lights dim if bright lights bother you and make your migraines worse. SEEK IMMEDIATE MEDICAL CARE IF:   Your migraine becomes severe.  You have a fever.  You have a stiff neck.  You have vision loss.  You have muscular weakness or loss of muscle control.  You start losing your balance or have trouble walking.  You feel faint or pass out.  You have severe symptoms that are different from your first symptoms. MAKE SURE YOU:   Understand these instructions.  Will watch your condition.  Will get help right away if you are not doing well or get worse.   This information is not intended to replace advice given to you by your health care provider. Make sure you discuss any questions you have with your health care provider.   Document Released: 02/08/2005 Document Revised: 03/01/2014 Document Reviewed: 10/16/2012 Elsevier Interactive Patient Education 2016 Elsevier Inc.  Nonspecific Chest Pain  Chest pain can be caused by many different conditions. There is always a chance that your pain could be related to something serious, such as a heart attack or a  blood clot in your lungs. Chest pain can also be caused by conditions that are not life-threatening. If you have chest pain, it is very important to follow up with your health care provider. CAUSES  Chest pain can be caused by:  Heartburn.  Pneumonia or bronchitis.  Anxiety or stress.  Inflammation around your heart (pericarditis) or lung (pleuritis or  pleurisy).  A blood clot in your lung.  A collapsed lung (pneumothorax). It can develop suddenly on its own (spontaneous pneumothorax) or from trauma to the chest.  Shingles infection (varicella-zoster virus).  Heart attack.  Damage to the bones, muscles, and cartilage that make up your chest wall. This can include:  Bruised bones due to injury.  Strained muscles or cartilage due to frequent or repeated coughing or overwork.  Fracture to one or more ribs.  Sore cartilage due to inflammation (costochondritis). RISK FACTORS  Risk factors for chest pain may include:  Activities that increase your risk for trauma or injury to your chest.  Respiratory infections or conditions that cause frequent coughing.  Medical conditions or overeating that can cause heartburn.  Heart disease or family history of heart disease.  Conditions or health behaviors that increase your risk of developing a blood clot.  Having had chicken pox (varicella zoster). SIGNS AND SYMPTOMS Chest pain can feel like:  Burning or tingling on the surface of your chest or deep in your chest.  Crushing, pressure, aching, or squeezing pain.  Dull or sharp pain that is worse when you move, cough, or take a deep breath.  Pain that is also felt in your back, neck, shoulder, or arm, or pain that spreads to any of these areas. Your chest pain may come and go, or it may stay constant. DIAGNOSIS Lab tests or other studies may be needed to find the cause of your pain. Your health care provider may have you take a test called an ambulatory ECG (electrocardiogram). An ECG records your heartbeat patterns at the time the test is performed. You may also have other tests, such as:  Transthoracic echocardiogram (TTE). During echocardiography, sound waves are used to create a picture of all of the heart structures and to look at how blood flows through your heart.  Transesophageal echocardiogram (TEE).This is a more advanced  imaging test that obtains images from inside your body. It allows your health care provider to see your heart in finer detail.  Cardiac monitoring. This allows your health care provider to monitor your heart rate and rhythm in real time.  Holter monitor. This is a portable device that records your heartbeat and can help to diagnose abnormal heartbeats. It allows your health care provider to track your heart activity for several days, if needed.  Stress tests. These can be done through exercise or by taking medicine that makes your heart beat more quickly.  Blood tests.  Imaging tests. TREATMENT  Your treatment depends on what is causing your chest pain. Treatment may include:  Medicines. These may include:  Acid blockers for heartburn.  Anti-inflammatory medicine.  Pain medicine for inflammatory conditions.  Antibiotic medicine, if an infection is present.  Medicines to dissolve blood clots.  Medicines to treat coronary artery disease.  Supportive care for conditions that do not require medicines. This may include:  Resting.  Applying heat or cold packs to injured areas.  Limiting activities until pain decreases. HOME CARE INSTRUCTIONS  If you were prescribed an antibiotic medicine, finish it all even if you start to feel better.  Avoid any activities that bring on chest pain.  Do not use any tobacco products, including cigarettes, chewing tobacco, or electronic cigarettes. If you need help quitting, ask your health care provider.  Do not drink alcohol.  Take medicines only as directed by your health care provider.  Keep all follow-up visits as directed by your health care provider. This is important. This includes any further testing if your chest pain does not go away.  If heartburn is the cause for your chest pain, you may be told to keep your head raised (elevated) while sleeping. This reduces the chance that acid will go from your stomach into your  esophagus.  Make lifestyle changes as directed by your health care provider. These may include:  Getting regular exercise. Ask your health care provider to suggest some activities that are safe for you.  Eating a heart-healthy diet. A registered dietitian can help you to learn healthy eating options.  Maintaining a healthy weight.  Managing diabetes, if necessary.  Reducing stress. SEEK MEDICAL CARE IF:  Your chest pain does not go away after treatment.  You have a rash with blisters on your chest.  You have a fever. SEEK IMMEDIATE MEDICAL CARE IF:   Your chest pain is worse.  You have an increasing cough, or you cough up blood.  You have severe abdominal pain.  You have severe weakness.  You faint.  You have chills.  You have sudden, unexplained chest discomfort.  You have sudden, unexplained discomfort in your arms, back, neck, or jaw.  You have shortness of breath at any time.  You suddenly start to sweat, or your skin gets clammy.  You feel nauseous or you vomit.  You suddenly feel light-headed or dizzy.  Your heart begins to beat quickly, or it feels like it is skipping beats. These symptoms may represent a serious problem that is an emergency. Do not wait to see if the symptoms will go away. Get medical help right away. Call your local emergency services (911 in the [place] Do not drive yourself to the hospital.   This information is not intended to replace advice given to you by your health care provider. Make sure you discuss any questions you have with your health care provider.   Document Released: 11/18/2004 Document Revised: 03/01/2014 Document Reviewed: 09/14/2013 Elsevier Interactive Patient Education 2016 Elsevier Inc.  Influenza, Adult Influenza ("the flu") is a viral infection of the respiratory tract. It occurs more often in winter months because people spend more time in close contact with one another. Influenza can make you feel very sick.  Influenza easily spreads from person to person (contagious). CAUSES  Influenza is caused by a virus that infects the respiratory tract. You can catch the virus by breathing in droplets from an infected person's cough or sneeze. You can also catch the virus by touching something that was recently contaminated with the virus and then touching your mouth, nose, or eyes. RISKS AND COMPLICATIONS You may be at risk for a more severe case of influenza if you smoke cigarettes, have diabetes, have chronic heart disease (such as heart failure) or lung disease (such as asthma), or if you have a weakened immune system. Elderly people and pregnant women are also at risk for more serious infections. The most common problem of influenza is a lung infection (pneumonia). Sometimes, this problem can require emergency medical care and may be life threatening. SIGNS AND SYMPTOMS  Symptoms typically last 4 to 10 days and may include:  Fever.  Chills.  Headache, body aches, and muscle aches.  Sore throat.  Chest discomfort and cough.  Poor appetite.  Weakness or feeling tired.  Dizziness.  Nausea or vomiting. DIAGNOSIS  Diagnosis of influenza is often made based on your history and a physical exam. A nose or throat swab test can be done to confirm the diagnosis. TREATMENT  In mild cases, influenza goes away on its own. Treatment is directed at relieving symptoms. For more severe cases, your health care provider may prescribe antiviral medicines to shorten the sickness. Antibiotic medicines are not effective because the infection is caused by a virus, not by bacteria. HOME CARE INSTRUCTIONS  Take medicines only as directed by your health care provider.  Use a cool mist humidifier to make breathing easier.  Get plenty of rest until your temperature returns to normal. This usually takes 3 to 4 days.  Drink enough fluid to keep your urine clear or pale yellow.  Cover yourmouth and nosewhen coughing or  sneezing,and wash your handswellto prevent thevirusfrom spreading.  Stay homefromwork orschool untilthe fever is gonefor at least 671full day. PREVENTION  An annual influenza vaccination (flu shot) is the best way to avoid getting influenza. An annual flu shot is now routinely recommended for all adults in the [place] SEEK MEDICAL CARE IF:  You experiencechest pain, yourcough worsens,or you producemore mucus.  Youhave nausea,vomiting, ordiarrhea.  Your fever returns or gets worse. SEEK IMMEDIATE MEDICAL CARE IF:  You havetrouble breathing, you become short of breath,or your skin ornails becomebluish.  You have severe painor stiffnessin the neck.  You develop a sudden headache, or pain in the face or ear.  You have nausea or vomiting that you cannot control. MAKE SURE YOU:   Understand these instructions.  Will watch your condition.  Will get help right away if you are not doing well or get worse.   This information is not intended to replace advice given to you by your health care provider. Make sure you discuss any questions you have with your health care provider.   Document Released: 02/06/2000 Document Revised: 03/01/2014 Document Reviewed: 05/10/2011 Elsevier Interactive Patient Education [employer]2016 Elsevier Inc.    To find a primary care or specialty doctor please call [phone] or [phone] to access "[facility] Find a Doctor Service."  You may also go on the [facility] website at [email or web address]  There are also multiple [facility] and [facility] practices throughout the [place] that are frequently accepting new patients. You may find a clinic that is close to your home and contact them.  [facility] -  [address] [place] [phone]  [facility] in [place] (also locations in [place] and [place]) -  [address] [place]  [id number] [phone]  [facility] -  [address] [place] [id number] [phone]

## 2015-06-18 NOTE — ED Provider Notes (Signed)
By signing my name below, I, Freida Busman, attest that this documentation has been prepared under the direction and in the presence of Kristen N Ward, DO . Electronically Signed: Freida Busman, Scribe. 06/18/2015. 1:52 AM.  TIME SEEN: 1:07 AM  CHIEF COMPLAINT:  Chief Complaint  Patient presents with  . Chest Pain     HPI:  HPI Comments:  [name] is a 40 y.o. female with a history of HTN, Migraine headaches, who presents to the Emergency Department complaining of HA with pain throughout since last night. She reports h/o migraines states pain today is similar except that her pain is diffuse. Pt reports associated nausea, which has resolved, CP with deep inspiration,  mild cough, and generalized body aches. Has had chills at home. They document a fever at home but does have an oral temperature here of 99.8. She denies tobacco use, h/o PE/DVT, use of estrogen hormone replacement therapy/BCP, recent surgery, and recent fractures. She also denies dysuria and hematuria. No flu shot this season. No aggravating or alleviating factors noted. No neck pain or neck stiffness. No numbness, tingling or focal weakness. No vomiting or diarrhea. No sick contacts, recent travel.   ROS: See HPI Constitutional: no fever  Eyes: no drainage  ENT: no runny nose   Cardiovascular:  chest pain  Resp: no SOB  GI: no vomiting GU: no dysuria Integumentary: no rash  Allergy: no hives  Musculoskeletal: no leg swelling  Neurological: no slurred speech ROS otherwise negative  PAST MEDICAL HISTORY/PAST SURGICAL HISTORY:  Past Medical History  Diagnosis Date  . Back pain   . Hypertension   . Anemia   . Blood transfusion without reported diagnosis     x3; s/p c/sections and hysterectomy    MEDICATIONS:  Prior to Admission medications   Medication Sig Start Date End Date Taking? Authorizing Provider  hydrochlorothiazide (HYDRODIURIL) 25 MG tablet Take 25 mg by mouth daily.   Yes Historical Provider,  MD  ondansetron (ZOFRAN-ODT) 4 MG disintegrating tablet Take 1 tablet (4 mg total) by mouth every 8 (eight) hours as needed for nausea or vomiting. Patient not taking: Reported on 06/18/2015 07/07/14   Ozella Rocks, MD    ALLERGIES:  Allergies  Allergen Reactions  . Peanut-Containing Drug Products Anaphylaxis  . Latex Rash    SOCIAL HISTORY:  Social History  Substance Use Topics  . Smoking status: Former Smoker    Quit date: 11/22/2012  . Smokeless tobacco: Not on file  . Alcohol Use: No    FAMILY HISTORY: Family History  Problem Relation Age of Onset  . Hypertension Mother   . Diabetes Father   . Cancer Sister     leukemia  . Cancer Maternal Aunt     breast  . Cancer Paternal Aunt     breast  . Diabetes Maternal Grandmother   . Cancer Maternal Grandfather     stomach  . Diabetes Maternal Grandfather     EXAM: BP 129/75 mmHg  Pulse 58  Temp(Src) 99.8 F (37.7 C) (Oral)  Resp 19  Ht  (1.676 m)  Wt 295 lb (133.811 kg)  BMI 47.64 kg/m2  SpO2 96% CONSTITUTIONAL: Alert and oriented and responds appropriately to questions. Well-appearing; well-nourished, Oral temperature 99.8, nontoxic appearing HEAD: Normocephalic EYES: Conjunctivae clear, PERRL, Photophobia ENT: normal nose; no rhinorrhea; moist mucous membranes NECK: Supple, no meningismus, no LAD  CARD: RRR; S1 and S2 appreciated; no murmurs, no clicks, no rubs, no gallops RESP: Normal chest excursion without  splinting or tachypnea; breath sounds clear and equal bilaterally; no wheezes, no rhonchi, no rales, no hypoxia or respiratory distress, speaking full sentences ABD/GI: Normal bowel sounds; non-distended; soft, non-tender, no rebound, no guarding, no peritoneal signs BACK:  The back appears normal and is non-tender to palpation, there is no CVA tenderness EXT: Normal ROM in all joints; non-tender to palpation; no edema; normal capillary refill; no cyanosis, no calf tenderness or swelling    SKIN:  Normal color for age and race; warm; no rash NEURO: Moves all extremities equally, sensation to light touch intact diffusely, cranial nerves II through XII intact PSYCH: The patient's mood and manner are appropriate. Grooming and personal hygiene are appropriate.  MEDICAL DECISION MAKING: Patient here with flulike symptoms for the past several days. We'll temp of 99.8 but she is nontoxic and hemodynamically stable. Labs ordered in triage including troponin negative. Will repeat second troponin although chest pain seems atypical. She has no risk factors for pulmonary embolus. She is not tachycardic, tachypneic, hypoxic. Chest x-ray is clear and shows no infiltrate. EKG shows no new ischemic abnormality. Headache seems typical of her prior migraines. Nothing to suggest meningitis, encephalitis. Will treat with Toradol, Reglan, Benadryl, IV fluids and reassess.  ED PROGRESS: 3:30 AM  Pt's headache is completely resolved after migraine cocktail. She reports feeling much better. A second troponin is negative. She is not pregnant. Again I suspect that this is her typical migraine and a viral illness. I feel she is safe to be discharged home. Discussed return precautions. Patient verbalizes understanding is comfortable with plan. Recommended resting, increase fluid intake, alternating Tylenol and ibuprofen at home. We'll provide with work note.   At this time, I do not feel there is any life-threatening condition present. I have reviewed and discussed all results (EKG, imaging, lab, urine as appropriate), exam findings with patient. I have reviewed nursing notes and appropriate previous records.  I feel the patient is safe to be discharged home without further emergent workup. Discussed usual and customary return precautions. Patient and family (if present) verbalize understanding and are comfortable with this plan.  Patient will follow-up with their primary care provider. If they do not have a primary care  provider, information for follow-up has been provided to them. All questions have been answered.      EKG Interpretation  Date/Time:  Tuesday June 17 2015 19:50:35 EDT Ventricular Rate:  82 PR Interval:  212 QRS Duration: 88 QT Interval:  334 QTC Calculation: 390 R Axis:   57 Text Interpretation:  Sinus rhythm with 1st degree A-V block Otherwise normal ECG No significant change since last tracing Confirmed by WARD,  DO, KRISTEN [phone]([id number]) on 06/18/2015 12:22:49 AM        I personally performed the services described in this documentation, which was scribed in my presence. The recorded information has been reviewed and is accurate.    Valerie MawKristen N Ward, DO 06/18/15 [id number]

## 2015-07-25 ENCOUNTER — Emergency Department (HOSPITAL_COMMUNITY)
Admission: EM | Admit: 2015-07-25 | Discharge: 2015-07-25 | Disposition: A | Discharge status: Home / Self Care | Payer: BLUE CROSS/BLUE SHIELD | Attending: Emergency Medicine | Admitting: Emergency Medicine

## 2015-07-25 ENCOUNTER — Encounter (HOSPITAL_COMMUNITY): Payer: Self-pay | Admitting: Emergency Medicine

## 2015-07-25 DIAGNOSIS — I10 Essential (primary) hypertension: Secondary | ICD-10-CM | POA: Insufficient documentation

## 2015-07-25 DIAGNOSIS — R51 Headache: Secondary | ICD-10-CM | POA: Insufficient documentation

## 2015-07-25 DIAGNOSIS — R110 Nausea: Secondary | ICD-10-CM | POA: Insufficient documentation

## 2015-07-25 DIAGNOSIS — R200 Anesthesia of skin: Secondary | ICD-10-CM | POA: Insufficient documentation

## 2015-07-25 NOTE — ED Notes (Signed)
The pt refuses to stay and be seen.  Unable to convince the pt to stay.  Her friend has already failed to convince her to stay

## 2015-07-25 NOTE — ED Notes (Signed)
C/o migraine and nausea since Wednesday.  Reports history of same.  Took Tylenol 500 mg for pain.  Also reports numbness to lips.  No facial droop.  Speech clear.  No arm drift.

## 2016-04-22 ENCOUNTER — Other Ambulatory Visit: Payer: Self-pay | Admitting: Nurse Practitioner

## 2016-04-22 DIAGNOSIS — Z1231 Encounter for screening mammogram for malignant neoplasm of breast: Secondary | ICD-10-CM

## 2016-05-17 ENCOUNTER — Ambulatory Visit: Payer: Self-pay

## 2016-10-12 DIAGNOSIS — F17210 Nicotine dependence, cigarettes, uncomplicated: Secondary | ICD-10-CM | POA: Diagnosis not present

## 2016-10-12 DIAGNOSIS — R102 Pelvic and perineal pain: Secondary | ICD-10-CM | POA: Diagnosis not present

## 2016-10-12 DIAGNOSIS — Z6841 Body Mass Index (BMI) 40.0 and over, adult: Secondary | ICD-10-CM | POA: Diagnosis not present

## 2016-10-12 DIAGNOSIS — Z5321 Procedure and treatment not carried out due to patient leaving prior to being seen by health care provider: Secondary | ICD-10-CM | POA: Diagnosis not present

## 2016-10-12 DIAGNOSIS — Z885 Allergy status to narcotic agent status: Secondary | ICD-10-CM | POA: Diagnosis not present

## 2016-10-12 DIAGNOSIS — B373 Candidiasis of vulva and vagina: Secondary | ICD-10-CM | POA: Diagnosis not present

## 2016-10-13 DIAGNOSIS — R102 Pelvic and perineal pain: Secondary | ICD-10-CM | POA: Diagnosis not present

## 2016-10-13 DIAGNOSIS — B373 Candidiasis of vulva and vagina: Secondary | ICD-10-CM | POA: Diagnosis not present

## 2016-10-13 DIAGNOSIS — F17210 Nicotine dependence, cigarettes, uncomplicated: Secondary | ICD-10-CM | POA: Diagnosis not present

## 2016-10-26 DIAGNOSIS — N760 Acute vaginitis: Secondary | ICD-10-CM | POA: Diagnosis not present

## 2016-10-26 DIAGNOSIS — F17210 Nicotine dependence, cigarettes, uncomplicated: Secondary | ICD-10-CM | POA: Diagnosis not present

## 2016-10-26 DIAGNOSIS — N898 Other specified noninflammatory disorders of vagina: Secondary | ICD-10-CM | POA: Diagnosis not present

## 2016-11-05 DIAGNOSIS — N760 Acute vaginitis: Secondary | ICD-10-CM | POA: Diagnosis not present

## 2016-11-05 DIAGNOSIS — N764 Abscess of vulva: Secondary | ICD-10-CM | POA: Diagnosis not present

## 2016-11-05 DIAGNOSIS — F17210 Nicotine dependence, cigarettes, uncomplicated: Secondary | ICD-10-CM | POA: Diagnosis not present

## 2016-11-05 DIAGNOSIS — R102 Pelvic and perineal pain: Secondary | ICD-10-CM | POA: Diagnosis not present

## 2016-11-22 DIAGNOSIS — Z6841 Body Mass Index (BMI) 40.0 and over, adult: Secondary | ICD-10-CM | POA: Diagnosis not present

## 2016-11-22 DIAGNOSIS — I10 Essential (primary) hypertension: Secondary | ICD-10-CM | POA: Diagnosis not present

## 2016-11-22 DIAGNOSIS — E559 Vitamin D deficiency, unspecified: Secondary | ICD-10-CM | POA: Diagnosis not present

## 2016-12-06 DIAGNOSIS — E559 Vitamin D deficiency, unspecified: Secondary | ICD-10-CM | POA: Diagnosis not present

## 2016-12-06 DIAGNOSIS — I10 Essential (primary) hypertension: Secondary | ICD-10-CM | POA: Diagnosis not present

## 2016-12-06 DIAGNOSIS — G44219 Episodic tension-type headache, not intractable: Secondary | ICD-10-CM | POA: Diagnosis not present

## 2016-12-06 DIAGNOSIS — G43101 Migraine with aura, not intractable, with status migrainosus: Secondary | ICD-10-CM | POA: Diagnosis not present

## 2016-12-06 DIAGNOSIS — G43001 Migraine without aura, not intractable, with status migrainosus: Secondary | ICD-10-CM | POA: Diagnosis not present

## 2017-04-27 DIAGNOSIS — L02211 Cutaneous abscess of abdominal wall: Secondary | ICD-10-CM | POA: Diagnosis not present

## 2017-04-27 DIAGNOSIS — Z885 Allergy status to narcotic agent status: Secondary | ICD-10-CM | POA: Diagnosis not present

## 2017-04-27 DIAGNOSIS — Z91040 Latex allergy status: Secondary | ICD-10-CM | POA: Diagnosis not present

## 2017-04-30 DIAGNOSIS — Z87891 Personal history of nicotine dependence: Secondary | ICD-10-CM | POA: Diagnosis not present

## 2017-04-30 DIAGNOSIS — Z4800 Encounter for change or removal of nonsurgical wound dressing: Secondary | ICD-10-CM | POA: Diagnosis not present

## 2017-07-12 DIAGNOSIS — G44219 Episodic tension-type headache, not intractable: Secondary | ICD-10-CM | POA: Diagnosis not present

## 2017-07-12 DIAGNOSIS — I10 Essential (primary) hypertension: Secondary | ICD-10-CM | POA: Diagnosis not present

## 2017-07-12 DIAGNOSIS — G43101 Migraine with aura, not intractable, with status migrainosus: Secondary | ICD-10-CM | POA: Diagnosis not present

## 2017-07-12 DIAGNOSIS — R0683 Snoring: Secondary | ICD-10-CM | POA: Diagnosis not present

## 2017-07-12 DIAGNOSIS — G43001 Migraine without aura, not intractable, with status migrainosus: Secondary | ICD-10-CM | POA: Diagnosis not present

## 2017-09-08 DIAGNOSIS — L500 Allergic urticaria: Secondary | ICD-10-CM | POA: Diagnosis not present

## 2017-12-30 DIAGNOSIS — I10 Essential (primary) hypertension: Secondary | ICD-10-CM | POA: Diagnosis not present

## 2017-12-30 DIAGNOSIS — R0982 Postnasal drip: Secondary | ICD-10-CM | POA: Diagnosis not present

## 2017-12-30 DIAGNOSIS — Z760 Encounter for issue of repeat prescription: Secondary | ICD-10-CM | POA: Diagnosis not present

## 2018-02-05 DIAGNOSIS — R51 Headache: Secondary | ICD-10-CM | POA: Diagnosis not present

## 2018-02-05 DIAGNOSIS — F17210 Nicotine dependence, cigarettes, uncomplicated: Secondary | ICD-10-CM | POA: Diagnosis not present

## 2018-02-05 DIAGNOSIS — R0982 Postnasal drip: Secondary | ICD-10-CM | POA: Diagnosis not present

## 2018-02-06 DIAGNOSIS — G960 Cerebrospinal fluid leak: Secondary | ICD-10-CM | POA: Diagnosis not present

## 2018-02-06 DIAGNOSIS — R0982 Postnasal drip: Secondary | ICD-10-CM | POA: Diagnosis not present

## 2018-02-06 DIAGNOSIS — R51 Headache: Secondary | ICD-10-CM | POA: Diagnosis not present

## 2018-02-07 DIAGNOSIS — G960 Cerebrospinal fluid leak: Secondary | ICD-10-CM | POA: Diagnosis not present

## 2018-02-20 DIAGNOSIS — G960 Cerebrospinal fluid leak: Secondary | ICD-10-CM | POA: Diagnosis not present

## 2018-02-23 DIAGNOSIS — G960 Cerebrospinal fluid leak: Secondary | ICD-10-CM | POA: Diagnosis not present

## 2018-02-27 DIAGNOSIS — G960 Cerebrospinal fluid leak: Secondary | ICD-10-CM | POA: Diagnosis not present

## 2018-03-03 DIAGNOSIS — G960 Cerebrospinal fluid leak: Secondary | ICD-10-CM | POA: Diagnosis not present

## 2018-03-08 DIAGNOSIS — G960 Cerebrospinal fluid leak: Secondary | ICD-10-CM | POA: Diagnosis not present

## 2018-03-24 DIAGNOSIS — L309 Dermatitis, unspecified: Secondary | ICD-10-CM | POA: Diagnosis not present

## 2018-03-24 DIAGNOSIS — Z791 Long term (current) use of non-steroidal anti-inflammatories (NSAID): Secondary | ICD-10-CM | POA: Diagnosis not present

## 2018-03-24 DIAGNOSIS — G932 Benign intracranial hypertension: Secondary | ICD-10-CM | POA: Diagnosis not present

## 2018-03-24 DIAGNOSIS — I10 Essential (primary) hypertension: Secondary | ICD-10-CM | POA: Diagnosis not present

## 2018-03-24 DIAGNOSIS — J342 Deviated nasal septum: Secondary | ICD-10-CM | POA: Diagnosis not present

## 2018-03-24 DIAGNOSIS — G960 Cerebrospinal fluid leak: Secondary | ICD-10-CM | POA: Diagnosis not present

## 2018-03-24 DIAGNOSIS — J343 Hypertrophy of nasal turbinates: Secondary | ICD-10-CM | POA: Diagnosis not present

## 2018-03-24 DIAGNOSIS — T50915A Adverse effect of multiple unspecified drugs, medicaments and biological substances, initial encounter: Secondary | ICD-10-CM | POA: Diagnosis not present

## 2018-03-24 DIAGNOSIS — G8918 Other acute postprocedural pain: Secondary | ICD-10-CM | POA: Diagnosis not present

## 2018-03-24 DIAGNOSIS — Z6841 Body Mass Index (BMI) 40.0 and over, adult: Secondary | ICD-10-CM | POA: Diagnosis not present

## 2018-03-24 DIAGNOSIS — F17210 Nicotine dependence, cigarettes, uncomplicated: Secondary | ICD-10-CM | POA: Diagnosis not present

## 2018-03-24 DIAGNOSIS — R400 Somnolence: Secondary | ICD-10-CM | POA: Diagnosis not present

## 2018-03-24 DIAGNOSIS — Z720 Tobacco use: Secondary | ICD-10-CM | POA: Diagnosis not present

## 2018-03-24 DIAGNOSIS — K219 Gastro-esophageal reflux disease without esophagitis: Secondary | ICD-10-CM | POA: Diagnosis not present

## 2018-03-24 DIAGNOSIS — D649 Anemia, unspecified: Secondary | ICD-10-CM | POA: Diagnosis not present

## 2018-03-24 DIAGNOSIS — G43909 Migraine, unspecified, not intractable, without status migrainosus: Secondary | ICD-10-CM | POA: Diagnosis not present

## 2018-04-03 DIAGNOSIS — G960 Cerebrospinal fluid leak: Secondary | ICD-10-CM | POA: Diagnosis not present

## 2018-04-13 DIAGNOSIS — S3421XD Injury of nerve root of lumbar spine, subsequent encounter: Secondary | ICD-10-CM | POA: Diagnosis not present

## 2018-04-17 DIAGNOSIS — G960 Cerebrospinal fluid leak: Secondary | ICD-10-CM | POA: Diagnosis not present

## 2018-07-27 DIAGNOSIS — E860 Dehydration: Secondary | ICD-10-CM | POA: Diagnosis not present

## 2018-07-27 DIAGNOSIS — I10 Essential (primary) hypertension: Secondary | ICD-10-CM | POA: Diagnosis not present

## 2018-07-27 DIAGNOSIS — Z885 Allergy status to narcotic agent status: Secondary | ICD-10-CM | POA: Diagnosis not present

## 2018-07-27 DIAGNOSIS — R531 Weakness: Secondary | ICD-10-CM | POA: Diagnosis not present

## 2018-07-27 DIAGNOSIS — N289 Disorder of kidney and ureter, unspecified: Secondary | ICD-10-CM | POA: Diagnosis not present

## 2018-07-27 DIAGNOSIS — Z79899 Other long term (current) drug therapy: Secondary | ICD-10-CM | POA: Diagnosis not present

## 2018-07-27 DIAGNOSIS — R55 Syncope and collapse: Secondary | ICD-10-CM | POA: Diagnosis not present

## 2018-07-27 DIAGNOSIS — F17210 Nicotine dependence, cigarettes, uncomplicated: Secondary | ICD-10-CM | POA: Diagnosis not present

## 2018-07-27 DIAGNOSIS — R000 Tachycardia, unspecified: Secondary | ICD-10-CM | POA: Diagnosis not present

## 2018-07-27 DIAGNOSIS — Z6841 Body Mass Index (BMI) 40.0 and over, adult: Secondary | ICD-10-CM | POA: Diagnosis not present

## 2018-07-27 DIAGNOSIS — R42 Dizziness and giddiness: Secondary | ICD-10-CM | POA: Diagnosis not present

## 2018-08-09 DIAGNOSIS — G932 Benign intracranial hypertension: Secondary | ICD-10-CM | POA: Diagnosis not present

## 2018-08-09 DIAGNOSIS — G960 Cerebrospinal fluid leak: Secondary | ICD-10-CM | POA: Diagnosis not present

## 2018-09-16 DIAGNOSIS — R300 Dysuria: Secondary | ICD-10-CM | POA: Diagnosis not present

## 2018-09-16 DIAGNOSIS — B373 Candidiasis of vulva and vagina: Secondary | ICD-10-CM | POA: Diagnosis not present

## 2018-09-16 DIAGNOSIS — N898 Other specified noninflammatory disorders of vagina: Secondary | ICD-10-CM | POA: Diagnosis not present

## 2018-09-16 DIAGNOSIS — F17290 Nicotine dependence, other tobacco product, uncomplicated: Secondary | ICD-10-CM | POA: Diagnosis not present

## 2018-09-16 DIAGNOSIS — Z885 Allergy status to narcotic agent status: Secondary | ICD-10-CM | POA: Diagnosis not present

## 2018-09-26 DIAGNOSIS — F17210 Nicotine dependence, cigarettes, uncomplicated: Secondary | ICD-10-CM | POA: Diagnosis not present

## 2018-09-26 DIAGNOSIS — R1030 Lower abdominal pain, unspecified: Secondary | ICD-10-CM | POA: Diagnosis not present

## 2018-09-26 DIAGNOSIS — B373 Candidiasis of vulva and vagina: Secondary | ICD-10-CM | POA: Diagnosis not present

## 2018-09-26 DIAGNOSIS — N390 Urinary tract infection, site not specified: Secondary | ICD-10-CM | POA: Diagnosis not present

## 2018-09-26 DIAGNOSIS — R309 Painful micturition, unspecified: Secondary | ICD-10-CM | POA: Diagnosis not present

## 2018-09-26 DIAGNOSIS — R300 Dysuria: Secondary | ICD-10-CM | POA: Diagnosis not present

## 2018-09-26 DIAGNOSIS — N898 Other specified noninflammatory disorders of vagina: Secondary | ICD-10-CM | POA: Diagnosis not present

## 2018-11-17 DIAGNOSIS — I10 Essential (primary) hypertension: Secondary | ICD-10-CM | POA: Diagnosis not present

## 2018-11-17 DIAGNOSIS — R5382 Chronic fatigue, unspecified: Secondary | ICD-10-CM | POA: Diagnosis not present

## 2018-11-17 DIAGNOSIS — G43009 Migraine without aura, not intractable, without status migrainosus: Secondary | ICD-10-CM | POA: Diagnosis not present

## 2018-11-17 DIAGNOSIS — G960 Cerebrospinal fluid leak: Secondary | ICD-10-CM | POA: Diagnosis not present
